# Patient Record
Sex: Female | Born: 1955 | Hispanic: No | Smoking: Current every day smoker
Health system: Southern US, Community
[De-identification: ages and names within clinical notes are randomized; demographics above are authoritative.]

## PROBLEM LIST (undated history)

## (undated) DIAGNOSIS — R569 Unspecified convulsions: Secondary | ICD-10-CM

## (undated) DIAGNOSIS — F329 Major depressive disorder, single episode, unspecified: Secondary | ICD-10-CM

## (undated) DIAGNOSIS — I10 Essential (primary) hypertension: Secondary | ICD-10-CM

## (undated) DIAGNOSIS — F32A Depression, unspecified: Secondary | ICD-10-CM

## (undated) DIAGNOSIS — K219 Gastro-esophageal reflux disease without esophagitis: Secondary | ICD-10-CM

## (undated) DIAGNOSIS — J449 Chronic obstructive pulmonary disease, unspecified: Secondary | ICD-10-CM

## (undated) DIAGNOSIS — E079 Disorder of thyroid, unspecified: Secondary | ICD-10-CM

## (undated) DIAGNOSIS — E039 Hypothyroidism, unspecified: Secondary | ICD-10-CM

## (undated) DIAGNOSIS — Z8619 Personal history of other infectious and parasitic diseases: Secondary | ICD-10-CM

## (undated) DIAGNOSIS — Z9889 Other specified postprocedural states: Secondary | ICD-10-CM

## (undated) DIAGNOSIS — R112 Nausea with vomiting, unspecified: Secondary | ICD-10-CM

## (undated) DIAGNOSIS — E119 Type 2 diabetes mellitus without complications: Secondary | ICD-10-CM

## (undated) DIAGNOSIS — I2510 Atherosclerotic heart disease of native coronary artery without angina pectoris: Secondary | ICD-10-CM

## (undated) HISTORY — PX: APPENDECTOMY: SHX54

## (undated) HISTORY — DX: Personal history of other infectious and parasitic diseases: Z86.19

## (undated) HISTORY — PX: TUBAL LIGATION: SHX77

---

## 2001-08-06 ENCOUNTER — Emergency Department (HOSPITAL_COMMUNITY): Admission: EM | Admit: 2001-08-06 | Discharge: 2001-08-06 | Payer: Self-pay | Admitting: Emergency Medicine

## 2002-07-17 ENCOUNTER — Ambulatory Visit (HOSPITAL_COMMUNITY): Admission: RE | Admit: 2002-07-17 | Discharge: 2002-07-17 | Payer: Self-pay | Admitting: Family Medicine

## 2003-01-03 ENCOUNTER — Encounter: Payer: Self-pay | Admitting: Emergency Medicine

## 2003-01-03 ENCOUNTER — Emergency Department (HOSPITAL_COMMUNITY): Admission: EM | Admit: 2003-01-03 | Discharge: 2003-01-03 | Payer: Self-pay | Admitting: Emergency Medicine

## 2004-09-12 ENCOUNTER — Emergency Department (HOSPITAL_COMMUNITY): Admission: EM | Admit: 2004-09-12 | Discharge: 2004-09-13 | Payer: Self-pay | Admitting: Emergency Medicine

## 2004-11-17 ENCOUNTER — Ambulatory Visit (HOSPITAL_COMMUNITY): Admission: RE | Admit: 2004-11-17 | Discharge: 2004-11-17 | Payer: Self-pay | Admitting: Family Medicine

## 2005-12-12 ENCOUNTER — Ambulatory Visit (HOSPITAL_COMMUNITY): Admission: RE | Admit: 2005-12-12 | Discharge: 2005-12-12 | Payer: Self-pay | Admitting: Family Medicine

## 2006-03-01 ENCOUNTER — Ambulatory Visit (HOSPITAL_COMMUNITY): Admission: RE | Admit: 2006-03-01 | Discharge: 2006-03-01 | Payer: Self-pay | Admitting: Family Medicine

## 2006-08-03 ENCOUNTER — Ambulatory Visit (HOSPITAL_COMMUNITY): Admission: RE | Admit: 2006-08-03 | Discharge: 2006-08-03 | Payer: Self-pay | Admitting: Family Medicine

## 2006-09-12 ENCOUNTER — Emergency Department (HOSPITAL_COMMUNITY): Admission: EM | Admit: 2006-09-12 | Discharge: 2006-09-12 | Payer: Self-pay | Admitting: Emergency Medicine

## 2006-11-26 ENCOUNTER — Inpatient Hospital Stay (HOSPITAL_COMMUNITY): Admission: EM | Admit: 2006-11-26 | Discharge: 2006-12-03 | Payer: Self-pay | Admitting: Emergency Medicine

## 2006-12-03 ENCOUNTER — Inpatient Hospital Stay (HOSPITAL_COMMUNITY): Admission: AD | Admit: 2006-12-03 | Discharge: 2006-12-07 | Payer: Self-pay | Admitting: *Deleted

## 2006-12-03 ENCOUNTER — Ambulatory Visit: Payer: Self-pay | Admitting: Psychiatry

## 2009-07-11 ENCOUNTER — Emergency Department (HOSPITAL_COMMUNITY): Admission: EM | Admit: 2009-07-11 | Discharge: 2009-07-11 | Payer: Self-pay | Admitting: Emergency Medicine

## 2010-08-03 NOTE — H&P (Signed)
[name], VANLOAN NO.:  [id number]   MEDICAL RECORD NO.:  [id number]          PATIENT TYPE:  IPS   LOCATION:  0502                          FACILITY:  [facility]   PHYSICIAN:  Geoffery Lyons, M.D.      DATE OF BIRTH:  January 21, 1956   DATE OF ADMISSION:  12/03/2006  DATE OF DISCHARGE:                       PSYCHIATRIC ADMISSION ASSESSMENT   IDENTIFYING INFORMATION:  This a 55 year old African-American female.  This is a voluntary admission.   HISTORY OF PRESENT ILLNESS:  This patient was transferred from the  medical unit at [facility] after she was admitted there on  November 26, 2006 after taking an overdose of Klonopin.  She claims that  she was not really intending to kill herself at that time and was  unclear about how many pills that she took.  She does admit that she had  been upset and frustrated with her husband who she felt had overspent  their budget.  Endorses having some financial problems.  Her husband had  called 9-1-1 and she had presented in the emergency room unresponsive.  Was groggy on admission to the medical unit and was hospitalized for  observation.   PAST PSYCHIATRIC HISTORY:  Remarkable for history of depression.  The  patient has endorsed up to 10 prior suicide attempts.  She has a history  of prior admissions to [facility] approximately five years  ago, to a facility in [place] about 40 years ago and one admission to  [facility] approximately 10 years ago and has been followed as an  outpatient at [facility].  She is currently  followed by Dr. Betti Cruz there.   CURRENT MEDICATIONS:  At the time of admission, Lamictal 100 mg twice  daily, Synthroid 100 mcg daily, Provera 2.5 mg daily, Zantac 150 mg  b.i.d. and Klonopin 0.5 mg three times a day.   ALCOHOL/DRUG HISTORY:  The patient has reported that she had history of  alcohol abuse in the distant past and has more than 20 years of  sobriety.   ALLERGIES:  BUPROPION and BUSPAR and possibly DIOVAN.   SOCIAL HISTORY:  Unemployed female, married, having some chronic  conflict with her husband including some financial issues, living on a  limited budget.  No current legal problems.  Basic education.   FAMILY HISTORY:  Remarkable for both father and mother with history of  alcohol abuse.   MEDICAL HISTORY:  Current medical problems include status post Klonopin  overdose, elevated liver enzymes related to medications, improving, has  a history of headaches NOS, hypertension, seizure disorder and  hypothyroidism.   LABORATORY DATA:  Diagnostic studies were remarkable for elevated liver  function tests with an AST up to 236 and ALT of 290, which were  improving with her most recent levels.  SGOT 98, SGPT 84, alkaline  phosphatase 52 and total bilirubin 0.7.  Her TSH was within normal  limits at 0.474 and acute hepatitis panel was negative.  A CT scan of  the abdomen and pelvis was also unremarkable other than showing some  fatty liver disease.  MEDICATIONS:  Medications at transfer were levothyroxine 100 mcg daily,  ranitidine 150 mg b.i.d., Norvasc 5 mg daily and Keppra 500 mg b.i.d.  Keppra had been substituted for the Lamictal which was discontinued due  to the elevated liver enzymes and the patient's Provera was  discontinued.   MENTAL STATUS EXAM:  Fully alert female, pleasant and cooperative.  Bright affect.  Speech is normal in production, articulate, relevant.  She is forthcoming with history.  Fully engaged in conversation.  Admits  to quite a bit of frustration with her home situation and some financial  stresses at home.  Admits to being depressed.  Has complained of having  some recent medical problems including some dysfunctional vaginal  bleeding and some transient abdominal pains.  Denying any suicidal  thoughts today.  No evidence of psychosis or hallucinations.  No  homicidal thought.  Cognition is  preserved.   DIAGNOSES:  AXIS I:  Depressive disorder not otherwise specified.  History of alcohol abuse, in remission.  AXIS II:  Deferred.  AXIS III:  Status post Klonopin overdose, hypothyroidism, elevated  transaminases not otherwise specified, seizure disorder.  AXIS IV:  Moderate (chronic marital conflict).  AXIS V:  Current 50; past year 70+.   PLAN:  To voluntarily admit the patient with 15-minute checks in place.  We have discussed medication management for her depression and she has  agreed to a trial of Depakote 500 mg p.o. q.h.s. and we will discontinue  her Keppra at this time.  Will also start Cymbalta 30 mg daily and will  plan to monitor her liver enzymes closely with hepatic enzyme panel  scheduled for December 06, 2006 in the morning.  Meanwhile, she has had  some chronic constipation.  We will start her on Colace 100 mg daily,  MiraLax 17 grams daily p.r.n. and Bentyl 20 mg for abdominal cramping.   ESTIMATED LENGTH OF STAY:  Five days.      Margaret A. Scott, N.P.      Geoffery Lyons, M.D.  Electronically Signed    MAS/MEDQ  D:  12/04/2006  T:  12/04/2006  Job:  [id number]

## 2010-08-03 NOTE — Group Therapy Note (Signed)
NAME:  [name], [name]                 ACCOUNT NO.:  [id number]   MEDICAL RECORD NO.:  [id number]          PATIENT TYPE:  INP   LOCATION:  A204                          FACILITY:  [facility]   PHYSICIAN:  Skeet Latch, DO    DATE OF BIRTH:  July 03, 1955   DATE OF PROCEDURE:  11/28/2006  DATE OF DISCHARGE:                                 PROGRESS NOTE   SUBJECTIVE:  The patient is stable at this time.  The patient is a more  alert and answering questions today.  The patient is still slightly  lethargic and is not willing to answer specific questions.  The patient  has a sitter at bedside and appears to be stable.   OBJECTIVE:  VITAL SIGNS:  Pulse 81, respirations 20, blood pressure  130/83.  The patient is satting 97% on room air.  CARDIOVASCULAR:  Regular rate and rhythm.  No rubs, murmurs or gallops.  LUNGS:  Clear to auscultation bilaterally.  No rales, rhonchi, or  wheezing.  ABDOMEN:  Soft, nontender, nondistended, positive bowel sounds.  No  rigidity or guarding.  EXTREMITIES:  No clubbing, cyanosis or edema.  Neurologically the  patient is more alert and awake today.  Appears to be moving all  extremities at this time.   LABORATORY DATA:  Sodium 142, potassium 2.9, chloride 110, CO2 25,  glucose 133, BUN 1, creatinine 0.61.   ASSESSMENT/PLAN:  1. Status post drug overdose.  The patient seems to be more awake and      alert today.  Sitter still present at bedside with the patient.      Will await ACT team to revisit and give their input regarding if      the patient needs to be admitted to behavior health facility.  2. Hypokalemia.  This is being replaced via IV.  Will repeat her      potassium level in the a.m.      Skeet Latch, DO  Electronically Signed     SM/MEDQ  D:  11/29/2006  T:  11/29/2006  Job:  [id number]

## 2010-08-03 NOTE — Discharge Summary (Signed)
[name], [name]                 ACCOUNT NO.:  [id number]   MEDICAL RECORD NO.:  [id number]          PATIENT TYPE:  INP   LOCATION:  A204                          FACILITY:  [facility]   PHYSICIAN:  Osvaldo Shipper, MD     DATE OF BIRTH:  1955-04-08   DATE OF ADMISSION:  11/26/2006  DATE OF DISCHARGE:  09/13/2008LH                               DISCHARGE SUMMARY   The patient is to be transferred to a inpatient psychiatric facility.   Please see H&P dictated by Dr. Lilian Kapur regarding the patient's illness.   DISCHARGE DIAGNOSES:  1. Suicidal attempt by overdosing on Klonopin.  2. Abnormal LFTs likely related to medications, improving.  3. History of headaches, stable.  4. Blurred vision likely requiring corrective lenses.  5. History of hypertension, stable.  6. History of seizure disorder, stable.  7. History of hypothyroidism, stable.   BRIEF HOSPITAL COURSE:  Problem #1.  Suicidal attempt.  The patient is a  55 year old Caucasian female who had a history of suicidal attempts in  the past who was brought in by EMS after she presented apparently had a  suicidal attempt.  She took Klonopin.  It is unclear how many pills she  took.  She was unresponsive when she presented.  The patient was in the  intensive care unit where she was monitored closely, was transferred out  when she became more lucid and awake.  She has improves significantly in  terms of her mental status and was fully awake and alert over the past  few days.  I found the patient to be very teary-eyed and crying.  She  stated that she wants to go home and take care of her husband.  Clearly  I think that the patient's mood is very unstable at this time and she  would best benefit by an evaluation by a psychiatrist.  Hence, I will  consult the Behavior Health Team today so that she can be transferred to  an inpatient psychiatric unit.   Problem #2.  Her seizure disorder has been stable.  She is on Lamictal.  When we  restarted these medications, her LFTs started going up.  The  etiology was not very clear.  AST was 236 as of yesterday and ALT of 290  as of yesterday.  We did do further extensive work-up and followup CT  scan of the abdomen and pelvis which showed fatty liver; otherwise  unremarkable.  Ultrasound of the abdomen was also done which did not  show any cholecystitis or any other findings.  Subsequently all these  medications were stopped and the patient's LFTs went down.  Her AST is  down to 159, and ALT down to 90.  Hence, I think these will continue to  improve.  As an alternative medication for the seizures, I am going to  start her on Keppra which she can take.  Keppra does not have any  adverse effect on the liver and she should be all right.  Hepatitis  profile was also negative.   She was continued on levothyroxine.  During this admission  her blood  pressure was found to be elevated.  She was started on Norvasc and now  her blood pressures are running quite the optimal range.   I have also stopped her Provera for now and her Paxil and her Klonopin  obviously.  Her psychiatrist may determine if she can be restarted on  these medications.   Yesterday the patient was complaining of some headache and some blurred  vision.  She was having almost double vision when she was watching the  TV.  However, she was as not having any double vision when she was  looking at me or when I did a quick visual check in the form of showing  her various objects and she was having any diplopia with that.  I think  the patient is mainly having some myopia.  She has been more than 10  years since she has seen an ophthalmologist.  I have asked her to see an  ophthalmologist as an outpatient.  She may need corrective lenses.  To  be sure there was nothing intracranial, we did a obtain a CT scan that  showed hyperdense sinuses which prompted MRA which showed irregular left  transverse sinus; otherwise  unremarkable.  So in view of all of the  above, the patient was considered stable for discharge today to an  outside facility.  The patient will not be discharged to home because of  her unstable mental health status.   DISCHARGE MEDICATIONS:  1. Levothyroxine 100 mcg daily.  2. Ranitidine 150 mg b.i.d.  3. Norvasc 5 mg daily.  4. Keppra 500 mg b.i.d.   FOLLOW UP:  To be determined at the time of discharge from psychiatric  facility.   Total time of discharge:  40 minutes.      Osvaldo Shipper, MD  Electronically Signed     GK/MEDQ  D:  12/02/2006  T:  12/02/2006  Job:  [id number]

## 2010-08-03 NOTE — Group Therapy Note (Signed)
NAME:  [name], [name]                 ACCOUNT NO.:  [id number]   MEDICAL RECORD NO.:  [id number]          PATIENT TYPE:  INP   LOCATION:  IC10                          FACILITY:  [facility]   PHYSICIAN:  Skeet Latch, DO    DATE OF BIRTH:  [birth year]   DATE OF PROCEDURE:  11/27/2006  DATE OF DISCHARGE:                                 PROGRESS NOTE   SUBJECTIVE:  The patient is seen to be stable at this time. The patient  is still very groggy.  On exam will not answer questions. The patient is  moving in bed and thrashing. The patient states that she feels like  someone is hurting her when we touch her for exam. The patient's  speech  is very sluggish at this time, but she does not appear to be in any  acute distress.   OBJECTIVE:  VITAL SIGNS: Temperature is 99.4, blood pressure 111/69,  respiratory rate is 19, pulse is 98.  CARDIOVASCULAR: Regular rate and rhythm, no prominent murmurs, rubs or  gallops.  LUNGS: The lungs are clear to auscultation without any rubs, rhonchi or  wheezes.  ABDOMEN: The abdomen is soft, nontender, nondistended. Positive bowel  sounds. No rigidity or guarding.  EXTREMITIES: No clubbing, cyanosis or edema.  NEUROLOGICAL: The patient is very sluggish and groggy on my exam. The  patient will answer questions at times. The patient seen to be moving  upper and lower extremities.   LABORATORY DATA:  TSH is 0.474. White count is 10.2, hemoglobin was  12.0, hematocrit 34.9, platelets 253.  ABG, pH 7.39, PCO2 35.5, PO2  39.2, bicarb 21.5, sodium 144, potassium 3.5, chloride 116, CO2 24,  glucose 113, BUN 6, creatinine 0.67. Alcohol level is less than 5. PTT  is 31, PT 13.7, INR is 1.0. Phosphorus 2.4, magnesium 1.9, calcium is  8.6.   ASSESSMENT AND PLAN:  1. Status post drug overdose. The patient continues to be somewhat      groggy secondary to receiving Ativan and Haldol. The patient does      not seem to be cooperative when she is alert and receives  sedatives. ACT team did see the patient and felt the patient was      too groggy to be evaluated at this time. I believe ACT team will      try to evaluate again in the morning.  2. Hypovolemia. This was seen to be resolved at this time.  3. The patient had history of depression. This probably will be      evaluated when she is stable for discharge probably to behavioral      health. I anticipate the patient being discharged in the next 1 to      2 days when stable.      Skeet Latch, DO  Electronically Signed     SM/MEDQ  D:  11/27/2006  T:  11/28/2006  Job:  [id number]

## 2010-08-03 NOTE — H&P (Signed)
NAME:  [name], [name]                 ACCOUNT NO.:  [id number]   MEDICAL RECORD NO.:  [id number]          PATIENT TYPE:  INP   LOCATION:  IC10                          FACILITY:  [facility]   PHYSICIAN:  Skeet Latch, DO    DATE OF BIRTH:  02-Jun-1955   DATE OF ADMISSION:  11/26/2006  DATE OF DISCHARGE:  LH                              HISTORY & PHYSICAL   CHIEF COMPLAINT:  Drug overdose.   HISTORY OF PRESENT ILLNESS:  This is a 55 year old Caucasian female who  presents via EMS secondary to an apparent suicide attempt.  Apparently,  the patient has a history of two prior suicide attempts, and today took  what is believed to be Klonopin.  How many Klonopin pills that the  patient took is unknown at this time.  Apparently, husband/boyfriend was  committing patient voluntarily when he called 9-1-1.  He stated that the  patient overdosed on pills.  On examination there were no husband,  friends or family present, so this history is obtained from the medical  chart.  Apparently, Narcan was given via EMS with minimal effect.  Apparently, the patient was found to have some Klonopin dated back from  July via the nursing staff.  The patient is extremely groggy on exam,  and I cannot get a medical history from the patient.   PAST MEDICAL HISTORY:  1. Depression.  2. Hypothyroidism.  3. Seizure disorder.  4. Prior suicide attempt.   PAST SURGICAL HISTORY:  None.   SOCIAL HISTORY:  Current smoker.  No history of alcohol abuse or illicit  drug use.   ALLERGIES:  UNABLE TO OBTAIN.  POSSIBLY WELLBUTRIN AND DIOVAN.   HOME MEDICATIONS:  1. Lamictal 100 mg b.i.d.  2. Levothyroxine 100 mcg as needed.  3. Provera 2.5 mg once daily.  4. Zantac 150 mg b.i.d.  5  Klonopin 0.5 mg three times a day.   REVIEW OF SYSTEMS:  Unobtainable.   PHYSICAL EXAMINATION:  GENERAL:  The patient is groggy.  Will respond to  questions by moving her head back and forth .  VITAL SIGNS:  Blood pressure 112/71, pulse  97, respirations 18,  temperature 97.6 .  HEENT:  Normocephalic, atraumatic.  Pupils are sluggish bilaterally.  No  scleral icterus is noted.  NECK:  Soft, supple, nondistended.  Oral mucosa is moist.  CARDIOVASCULAR:  Regular rate and rhythm.  No murmurs, rubs or gallops.  LUNGS:  Clear to auscultation bilaterally.  No rhonchi, rales or  wheezes.  ABDOMEN:  Soft, nondistended, positive bowel sounds .  EXTREMITIES:  No cyanosis, clubbing or edema.  NEUROLOGICAL:  Cranial nerves II-XII grossly intact.  The patient is  extremely drowsy.   LABORATORY DATA:  White count 7.5, hemoglobin 13.0, hematocrit 37.6,  platelets 238.  Urine pregnancy test was negative.  Patient is positive  for benzodiazepines on urine drug screen.  Acetaminophen level is less  than 10.  Sodium 134, potassium 2.8, chloride 102, CO2 25, glucose 165,  BUN 8, creatinine 0.77, calcium 8.1, total protein 6.1, AST 25, ALT 27,  salicylate level less than  4.   ASSESSMENT:  1. Drug overdose.  2. Hypokalemia.  3. History of depression.  4. History of hypothyroidism.  5. History of seizure disorder.  6. Prior suicide attempts.   PLAN:  The patient will be admitted to ICU for close up monitoring.  The  patient is not intubated at this time.  Her vital signs seem to be  stable.  Will follow vital signs closely.  Will get a repeat metabolic  panel in the next four hours.  The patient will be placed on suicide as  well as seizure precautions.  I will get strict I's and O's.  Foley will  be maintained.  She will be IV hydrated.  The patient will get an ACT  team consult when stable also.  Will get an ABG in the a.m. as well as  an EKG.  Will consult E. Link to manage overnight also.      Skeet Latch, DO  Electronically Signed     SM/MEDQ  D:  11/26/2006  T:  11/27/2006  Job:  [id number]

## 2010-08-06 NOTE — Discharge Summary (Signed)
NAMEJENAVI, BEEDLE NO.:  [id number]   MEDICAL RECORD NO.:  [id number]          PATIENT TYPE:  IPS   LOCATION:  0502                          FACILITY[facility]  [facility]   PHYSICIAN:  Geoffery Lyons, M.D.      DATE OF BIRTH:  05-24-1955   DATE OF ADMISSION:  12/03/2006  DATE OF DISCHARGE:  12/07/2006                               DISCHARGE SUMMARY   CHIEF COMPLAINT AND PRESENT ILLNESS:  This was the first admission to  [facility] for this 55 year old African-American  female voluntarily admitted.  Transferred from the medical unit at [facility].  She was admitted November 26, 2006 after taking an overdose of  Klonopin.  Claimed that she was not really intending to kill herself at  that time.  It was unclear about how many pills she took.  She had been  upset and frustrated with her husband whom she felt had overspent their  budget.  Endorsed having some financial problems.  Husband called 9-1-1.  She presented to the ED unresponsive.   PAST PSYCHIATRIC HISTORY:  Longstanding history of depression.  Endorsed  up to 10 prior suicide attempts.  Prior admissions to [facility] five years ago, to a facility in [place] about four years  ago and [facility] 10 years ago.  Some outpatient at [facility].  Currently seeing Dr. Betti Cruz.   ALCOHOL/DRUG HISTORY:  Claims 20 years sobriety.   MEDICATIONS:  Lamictal 100 mg twice a day, Synthroid 100 mcg per day,  Provera 2.5 mg per day, Zantac 150 mg twice a day, Klonopin 0.5 mg three  times a day.   PHYSICAL EXAMINATION:  Performed and failed to show any acute findings.   LABORATORY DATA:  SGOT 236, SGPT 290.  Repeated several days, SGOT 98,  SGPT 84, bilirubin 0.7, TSH 0.474.  Hepatitis panel negative.  CT scan  of the abdomen and pelvis was unremarkable other than showing some fatty  liver.   MEDICATIONS:  Levothyroxine 100 mcg per day, Zantac 150 mg twice a day,  Norvasc 5 mg  per day, Keppra 500 mg twice a day.  Keppra had been  substituted for the Lamictal.  Was discontinued due to the elevated  liver enzymes.   MENTAL STATUS EXAM:  Fully alert female, pleasant, cooperative.  Speech  was normal in tempo and production, articulate, relevant.  Forthcoming  with the history, engaged in the conversation.  Endorsed frustration  with the home situation, some financial stress.  Admits to being  depressed but denied any active suicidal or homicidal ideation.  No  evidence of delusions.  No hallucinations.  Cognition well-preserved.   ADMISSION DIAGNOSES:  AXIS I:  Major depressive disorder.  AXIS II:  No diagnosis.  AXIS III:  Hypothyroidism, elevated transaminases, seizure disorder.  AXIS IV:  Moderate.  AXIS V:  GAF upon admission 45; highest GAF in the last year 70.   HOSPITAL COURSE:  She was admitted.  She was started in individual and  group psychotherapy.  She was maintained on the Norvasc 5  mg per day,  Keppra 500 mg twice a day, Provera 2.5 mg per day, Synthroid 100 mcg per  day, Zantac 150 mg twice a day.  She had been on Xanax 0.5 mg every  eight hours as needed for anxiety.  Keppra was eventually discontinued.  She was placed on Depakote ER 500 mg at night, Cymbalta 30 mg per day.  Lamictal had also been discontinued.  As already stated, this was a 68-  year-old female, married, four children, ages 27, 55, 93 and 63, who  endorsed she really was not trying to hurt herself.  Claimed she was  taking a shower, stepped out, slipped, hit her head, claims she was  experiencing a headache, could not help herself but started taking the  Klonopin as she cannot take a lot of the analgesics.  She passed out.  She was taken to the ED and the ICU.  Endorsed she had been diagnosed  bipolar.  Described episodes of hyperthymia with increased energy,  racing thoughts, pressured speech, decreased sleep, impulsive behavior  for weeks, then depression with no energy, no  motivation.  Has had the  suicide attempts/gestures in the past when she was depressed.  Last time  in 1999.  Has a history of seizures.  Was on Lamictal to treat the  bipolar and the seizures yet says it was too sedating, affected her  coordination for what she went to a decrease and went up to the  Klonopin.  Had been on Paxil, Zoloft, Prozac, Wellbutrin, BuSpar,  Serzone, lithium, Risperdal and Abilify with no benefit or side effects.  Has been at [facility].  Unable to work yet denied  disability.  By December 05, 2006, she endorsed she was having a very  difficult time with sleep, woke up the night before with a headache,  wanted to have the Xanax at night as she claimed it did help in the  medical floor.  Endorsed that she really wanted to give the Depakote a  good try.  Wanted to see if she could get the help for her mood swings.  Still feeling pretty unstable.  Worried about the conflict with the  husband.  So we pursued the Depakote.  She did sleep all night with the  Xanax.  She tolerated the Depakote well.  The husband was going to come  and sit with her.  She was going to identify all the things that she saw  going wrong in the relationship.  Endorsed that she is back home alone  in her room, restricted to her room.  She pretty much thinks about  giving up.  By December 07, 2006, she was in full contact with reality.  There were no active suicidal or homicidal ideation.  No delusions.  No  hallucinations.  She had tolerated the medication well.  Felt that her  mood was more stable.  Was willing and motivated to pursue further  outpatient treatment.  Felt she could handle the situation at home much  better.   DISCHARGE DIAGNOSES:  AXIS I:  Bipolar disorder, depressed.  AXIS II:  No diagnosis.  AXIS III:  Hypothyroidism, elevated transaminases.  AXIS IV:  Moderate.  AXIS V:  GAF upon discharge 50.   DISCHARGE MEDICATIONS:  1. Norvasc 5 mg per day.  2.  Synthroid 100 mcg per day.  3. Zantac 150 mg per day.  4. Cymbalta 30 mg per day.  5. Depakote ER 500 mg at night.  6. Xanax  0.5 mg twice a day and at bedtime.  7. Pepcid 20 mg per day.   FOLLOWUP:  [facility].      Geoffery Lyons, M.D.  Electronically Signed     IL/MEDQ  D:  12/26/2006  T:  12/27/2006  Job:  [id number]

## 2010-12-30 LAB — HEPATIC FUNCTION PANEL
AST: 50 — ABNORMAL HIGH
Albumin: 4
Alkaline Phosphatase: 57
Total Bilirubin: 1.1
Total Protein: 7.1

## 2010-12-31 LAB — TSH: TSH: 0.474

## 2010-12-31 LAB — COMPREHENSIVE METABOLIC PANEL
ALT: 25
ALT: 79 — ABNORMAL HIGH
ALT: 94 — ABNORMAL HIGH
AST: 159 — ABNORMAL HIGH
Albumin: 3.8
Albumin: 3.9
Alkaline Phosphatase: 52
Alkaline Phosphatase: 56
Alkaline Phosphatase: 58
BUN: 6
BUN: 6
BUN: 8
BUN: 9
BUN: <1 — ABNORMAL LOW
CO2: 24
CO2: 26
CO2: 27
Calcium: 8.4
Calcium: 9.5
Calcium: 9.6
Chloride: 102
Chloride: 106
Chloride: 108
Creatinine, Ser: 0.67
Creatinine, Ser: 0.69
Creatinine, Ser: 0.77
GFR calc Af Amer: >60
GFR calc non Af Amer: >60
GFR calc non Af Amer: >60
GFR calc non Af Amer: >60
Glucose, Bld: 113 — ABNORMAL HIGH
Glucose, Bld: 124 — ABNORMAL HIGH
Glucose, Bld: 124 — ABNORMAL HIGH
Glucose, Bld: 165 — ABNORMAL HIGH
Potassium: 2.8 — ABNORMAL LOW
Potassium: 3.6
Potassium: 4
Sodium: 141
Sodium: 144
Sodium: 144
Total Bilirubin: 0.8
Total Bilirubin: 0.9
Total Bilirubin: 0.9
Total Protein: 5.9 — ABNORMAL LOW
Total Protein: 6.3
Total Protein: 6.5

## 2010-12-31 LAB — DIFFERENTIAL
Basophils Absolute: 0
Basophils Absolute: 0
Basophils Relative: 0
Basophils Relative: 1
Eosinophils Relative: 1
Lymphocytes Relative: 19
Lymphocytes Relative: 22
Lymphocytes Relative: 32
Lymphs Abs: 2
Monocytes Relative: 9
Neutro Abs: 3.3
Neutro Abs: 5.3
Neutrophils Relative %: 55
Neutrophils Relative %: 71

## 2010-12-31 LAB — AMYLASE
Amylase: 31
Amylase: 33

## 2010-12-31 LAB — BLOOD GAS, ARTERIAL
O2 Content: 2
Patient temperature: 98.6

## 2010-12-31 LAB — BASIC METABOLIC PANEL
BUN: 1 — ABNORMAL LOW
BUN: 3 — ABNORMAL LOW
BUN: <1 — ABNORMAL LOW
CO2: 25
Calcium: 8.9
Calcium: 9
Chloride: 112
Creatinine, Ser: 0.56
Creatinine, Ser: 0.61
Creatinine, Ser: 0.63
GFR calc Af Amer: >60
GFR calc non Af Amer: >60
Glucose, Bld: 137 — ABNORMAL HIGH
Sodium: 145

## 2010-12-31 LAB — URINALYSIS, ROUTINE W REFLEX MICROSCOPIC
Nitrite: NEGATIVE
Protein, ur: NEGATIVE
Specific Gravity, Urine: <1.005 — ABNORMAL LOW
Urobilinogen, UA: 0.2

## 2010-12-31 LAB — HEPATIC FUNCTION PANEL
ALT: 84 — ABNORMAL HIGH
AST: 98 — ABNORMAL HIGH
Albumin: 3.6
Bilirubin, Direct: 0.1
Bilirubin, Direct: 0.1
Indirect Bilirubin: 0.7
Total Bilirubin: 0.7
Total Protein: 5.9 — ABNORMAL LOW

## 2010-12-31 LAB — CBC
HCT: 34.9 — ABNORMAL LOW
HCT: 37.6
Hemoglobin: 12.7
Hemoglobin: 13
MCV: 90
Platelets: 248
Platelets: 253
Platelets: 263
RBC: 3.86 — ABNORMAL LOW
RDW: 12.4
RDW: 12.5
WBC: 10.2

## 2010-12-31 LAB — RAPID URINE DRUG SCREEN, HOSP PERFORMED
Barbiturates: NOT DETECTED
Cocaine: NOT DETECTED
Opiates: NOT DETECTED

## 2010-12-31 LAB — ACETAMINOPHEN LEVEL: Acetaminophen (Tylenol), Serum: <10 — ABNORMAL LOW

## 2010-12-31 LAB — HEPATITIS PANEL, ACUTE
Hep A IgM: NEGATIVE
Hep B C IgM: NEGATIVE

## 2010-12-31 LAB — MAGNESIUM
Magnesium: 1.8
Magnesium: 1.9

## 2010-12-31 LAB — URINE MICROSCOPIC-ADD ON

## 2010-12-31 LAB — PHOSPHORUS
Phosphorus: 2.4
Phosphorus: 3.4

## 2010-12-31 LAB — LIPASE, BLOOD
Lipase: 15
Lipase: 15

## 2010-12-31 LAB — PROTIME-INR
INR: 1
Prothrombin Time: 13.7

## 2010-12-31 LAB — SALICYLATE LEVEL: Salicylate Lvl: <4

## 2010-12-31 LAB — APTT: aPTT: 31

## 2011-01-05 LAB — CBC
HCT: 38.4
Hemoglobin: 13.5
MCHC: 35
Platelets: 300
RDW: 13.1

## 2011-01-05 LAB — DIFFERENTIAL
Basophils Absolute: 0.1
Basophils Relative: 1
Eosinophils Relative: 4
Monocytes Absolute: 0.5
Neutro Abs: 3.5

## 2011-01-05 LAB — BASIC METABOLIC PANEL
BUN: 6
CO2: 24
Calcium: 9.1
Glucose, Bld: 110 — ABNORMAL HIGH
Sodium: 141

## 2012-11-12 ENCOUNTER — Encounter (HOSPITAL_COMMUNITY): Payer: Self-pay | Admitting: *Deleted

## 2012-11-12 ENCOUNTER — Emergency Department (HOSPITAL_COMMUNITY)
Admission: EM | Admit: 2012-11-12 | Discharge: 2012-11-12 | Disposition: A | Discharge status: Home / Self Care | Payer: Self-pay | Attending: Emergency Medicine | Admitting: Emergency Medicine

## 2012-11-12 DIAGNOSIS — Z23 Encounter for immunization: Secondary | ICD-10-CM | POA: Insufficient documentation

## 2012-11-12 DIAGNOSIS — X58XXXA Exposure to other specified factors, initial encounter: Secondary | ICD-10-CM | POA: Insufficient documentation

## 2012-11-12 DIAGNOSIS — F329 Major depressive disorder, single episode, unspecified: Secondary | ICD-10-CM | POA: Insufficient documentation

## 2012-11-12 DIAGNOSIS — I10 Essential (primary) hypertension: Secondary | ICD-10-CM | POA: Insufficient documentation

## 2012-11-12 DIAGNOSIS — Z862 Personal history of diseases of the blood and blood-forming organs and certain disorders involving the immune mechanism: Secondary | ICD-10-CM | POA: Insufficient documentation

## 2012-11-12 DIAGNOSIS — Z8639 Personal history of other endocrine, nutritional and metabolic disease: Secondary | ICD-10-CM | POA: Insufficient documentation

## 2012-11-12 DIAGNOSIS — Y929 Unspecified place or not applicable: Secondary | ICD-10-CM | POA: Insufficient documentation

## 2012-11-12 DIAGNOSIS — S0502XA Injury of conjunctiva and corneal abrasion without foreign body, left eye, initial encounter: Secondary | ICD-10-CM

## 2012-11-12 DIAGNOSIS — S058X9A Other injuries of unspecified eye and orbit, initial encounter: Secondary | ICD-10-CM | POA: Insufficient documentation

## 2012-11-12 DIAGNOSIS — F411 Generalized anxiety disorder: Secondary | ICD-10-CM | POA: Insufficient documentation

## 2012-11-12 DIAGNOSIS — F3289 Other specified depressive episodes: Secondary | ICD-10-CM | POA: Insufficient documentation

## 2012-11-12 DIAGNOSIS — I2510 Atherosclerotic heart disease of native coronary artery without angina pectoris: Secondary | ICD-10-CM | POA: Insufficient documentation

## 2012-11-12 DIAGNOSIS — F17200 Nicotine dependence, unspecified, uncomplicated: Secondary | ICD-10-CM | POA: Insufficient documentation

## 2012-11-12 DIAGNOSIS — G40909 Epilepsy, unspecified, not intractable, without status epilepticus: Secondary | ICD-10-CM | POA: Insufficient documentation

## 2012-11-12 DIAGNOSIS — Y939 Activity, unspecified: Secondary | ICD-10-CM | POA: Insufficient documentation

## 2012-11-12 HISTORY — DX: Essential (primary) hypertension: I10

## 2012-11-12 HISTORY — DX: Unspecified convulsions: R56.9

## 2012-11-12 HISTORY — DX: Atherosclerotic heart disease of native coronary artery without angina pectoris: I25.10

## 2012-11-12 HISTORY — DX: Disorder of thyroid, unspecified: E07.9

## 2012-11-12 HISTORY — DX: Depression, unspecified: F32.A

## 2012-11-12 HISTORY — DX: Major depressive disorder, single episode, unspecified: F32.9

## 2012-11-12 MED ORDER — TOBRAMYCIN 0.3 % OP SOLN
2.0000 [drp] | OPHTHALMIC | Status: DC
  Administered 2012-11-12: 2 [drp] via OPHTHALMIC
  Filled 2012-11-12: qty 5

## 2012-11-12 MED ORDER — TETANUS-DIPHTH-ACELL PERTUSSIS 5-2.5-18.5 LF-MCG/0.5 IM SUSP
0.5000 mL | Freq: Once | INTRAMUSCULAR | Status: AC
  Administered 2012-11-12: 0.5 mL via INTRAMUSCULAR
  Filled 2012-11-12: qty 0.5

## 2012-11-12 MED ORDER — KETOROLAC TROMETHAMINE 10 MG PO TABS
10.0000 mg | ORAL_TABLET | Freq: Once | ORAL | Status: AC
  Administered 2012-11-12: 10 mg via ORAL
  Filled 2012-11-12: qty 1

## 2012-11-12 MED ORDER — FLUORESCEIN SODIUM 1 MG OP STRP
ORAL_STRIP | OPHTHALMIC | Status: AC
  Administered 2012-11-12: 19:00:00
  Filled 2012-11-12: qty 1

## 2012-11-12 MED ORDER — HYDROCODONE-ACETAMINOPHEN 5-325 MG PO TABS
2.0000 | ORAL_TABLET | Freq: Once | ORAL | Status: AC
  Administered 2012-11-12: 2 via ORAL
  Filled 2012-11-12: qty 2

## 2012-11-12 MED ORDER — TETRACAINE HCL 0.5 % OP SOLN
OPHTHALMIC | Status: AC
  Administered 2012-11-12: 19:00:00
  Filled 2012-11-12: qty 2

## 2012-11-12 MED ORDER — HYDROCODONE-ACETAMINOPHEN 5-325 MG PO TABS: 1.0000 | ORAL_TABLET | ORAL | Status: DC | PRN

## 2012-11-12 MED ORDER — MELOXICAM 7.5 MG PO TABS: ORAL_TABLET | ORAL | Status: DC

## 2012-11-12 MED ORDER — ONDANSETRON HCL 4 MG PO TABS
4.0000 mg | ORAL_TABLET | Freq: Once | ORAL | Status: AC
  Administered 2012-11-12: 4 mg via ORAL
  Filled 2012-11-12: qty 1

## 2012-11-12 NOTE — ED Provider Notes (Signed)
Medical screening examination/treatment/procedure(s) were performed by non-physician practitioner and as supervising physician I was immediately available for consultation/collaboration.   Maddix Heinz L Jere Bostrom, MD 11/12/12 2022 

## 2012-11-12 NOTE — ED Notes (Signed)
Slit lamp to bedside.

## 2012-11-12 NOTE — ED Provider Notes (Signed)
CSN: [id number]     Arrival date & time 11/12/12  1739 History   First MD Initiated Contact with Patient 11/12/12 1809     Chief Complaint  Patient presents with  . Eye Pain   (Consider location/radiation/quality/duration/timing/severity/associated sxs/prior Treatment) Patient is a 57 y.o. female presenting with eye pain. The history is provided by the patient.  Eye Pain This is a new problem. The current episode started in the past 7 days. The problem occurs constantly. The problem has been gradually worsening. Pertinent negatives include no abdominal pain, arthralgias, chest pain, coughing, fever or neck pain. Exacerbated by: bright light. She has tried nothing for the symptoms. The treatment provided no relief.    Past Medical History  Diagnosis Date  . Hypertension   . Seizures   . Depression   . Thyroid disease   . Coronary artery disease    Past Surgical History  Procedure Laterality Date  . Tubal ligation     History reviewed. No pertinent family history. History  Substance Use Topics  . Smoking status: Current Every Day Smoker  . Smokeless tobacco: Not on file  . Alcohol Use: No   OB History   Grav Para Term Preterm Abortions TAB SAB Ect Mult Living                 Review of Systems  Constitutional: Negative for fever and activity change.       All ROS Neg except as noted in HPI  HENT: Negative for nosebleeds and neck pain.   Eyes: Positive for pain. Negative for photophobia and discharge.  Respiratory: Negative for cough, shortness of breath and wheezing.   Cardiovascular: Negative for chest pain and palpitations.  Gastrointestinal: Negative for abdominal pain and blood in stool.  Genitourinary: Negative for dysuria, frequency and hematuria.  Musculoskeletal: Negative for back pain and arthralgias.  Skin: Negative.   Neurological: Positive for seizures. Negative for dizziness and speech difficulty.  Psychiatric/Behavioral: Negative for hallucinations and  confusion.       Depression    Allergies  Buspar; Wellbutrin; and Zoloft  Home Medications   Current Outpatient Rx  Name  Route  Sig  Dispense  Refill  . HYDROcodone-acetaminophen (NORCO/VICODIN) 5-325 MG per tablet   Oral   Take 1 tablet by mouth every 4 (four) hours as needed for pain.   15 tablet   0   . meloxicam (MOBIC) 7.5 MG tablet      1 po bid with food   12 tablet   0    BP 115/63  Pulse 101  Temp(Src) 98.8 F (37.1 C) (Oral)  Resp 20  Ht 5\' 1"  (1.549 m)  Wt 120 lb (54.432 kg)  BMI 22.69 kg/m2  SpO2 100% Physical Exam  Nursing note and vitals reviewed. Constitutional: She is oriented to person, place, and time. She appears well-developed and well-nourished.  Non-toxic appearance.  HENT:  Head: Normocephalic.  Right Ear: Tympanic membrane and external ear normal.  Left Ear: Tympanic membrane and external ear normal.  Eyes: EOM and lids are normal. Pupils are equal, round, and reactive to light. Left eye exhibits no hordeolum. No foreign body present in the left eye. Left conjunctiva is injected.  Fundoscopic exam:      The left eye shows no AV nicking, no hemorrhage and no papilledema.  Slit lamp exam:      The left eye shows corneal abrasion and fluorescein uptake. The left eye shows no foreign body and no hyphema.  There is increase redness of the conjunctiva under the upper lid. Soreness of this area. No periorbital area redness or increase warmth. Anterior chamber clear.  Neck: Normal range of motion. Neck supple. Carotid bruit is not present.  Cardiovascular: Normal rate, regular rhythm, normal heart sounds, intact distal pulses and normal pulses.   Pulmonary/Chest: Breath sounds normal. No respiratory distress.  Abdominal: Soft. Bowel sounds are normal. There is no tenderness. There is no guarding.  Musculoskeletal: Normal range of motion.  Lymphadenopathy:       Head (right side): No submandibular adenopathy present.       Head (left side): No  submandibular adenopathy present.    She has no cervical adenopathy.  Neurological: She is alert and oriented to person, place, and time. She has normal strength. No cranial nerve deficit or sensory deficit.  Skin: Skin is warm and dry.  Psychiatric: Her speech is normal. Her mood appears anxious.    ED Course  Procedures (including critical care time) Labs Review Labs Reviewed - No data to display Imaging Review No results found.  MDM   1. Corneal abrasion, left, initial encounter    **I have reviewed nursing notes, vital signs, and all appropriate lab and imaging results for this patient.*  Pt noted to have fluorescein uptake at the 4:00 and center cornea positions. Pt sore at the upper lip area. No FB noted. Pt treated with tobramycin eye drops, mobic, and norco. Pt to see the opthalmologist for evaluaiton if not improving.  Kathie Dike, PA-C 11/12/12 1912

## 2012-11-12 NOTE — ED Notes (Signed)
Pain lt eye for 3 days, no injury,

## 2012-11-27 ENCOUNTER — Emergency Department (HOSPITAL_COMMUNITY)
Admission: EM | Admit: 2012-11-27 | Discharge: 2012-11-28 | Disposition: A | Discharge status: Home / Self Care | Payer: Self-pay | Attending: Emergency Medicine | Admitting: Emergency Medicine

## 2012-11-27 ENCOUNTER — Encounter (HOSPITAL_COMMUNITY): Payer: Self-pay

## 2012-11-27 DIAGNOSIS — F329 Major depressive disorder, single episode, unspecified: Secondary | ICD-10-CM

## 2012-11-27 DIAGNOSIS — Z9851 Tubal ligation status: Secondary | ICD-10-CM | POA: Insufficient documentation

## 2012-11-27 DIAGNOSIS — F17200 Nicotine dependence, unspecified, uncomplicated: Secondary | ICD-10-CM | POA: Insufficient documentation

## 2012-11-27 DIAGNOSIS — Z862 Personal history of diseases of the blood and blood-forming organs and certain disorders involving the immune mechanism: Secondary | ICD-10-CM | POA: Insufficient documentation

## 2012-11-27 DIAGNOSIS — R45851 Suicidal ideations: Secondary | ICD-10-CM | POA: Insufficient documentation

## 2012-11-27 DIAGNOSIS — I10 Essential (primary) hypertension: Secondary | ICD-10-CM | POA: Insufficient documentation

## 2012-11-27 DIAGNOSIS — I2510 Atherosclerotic heart disease of native coronary artery without angina pectoris: Secondary | ICD-10-CM | POA: Insufficient documentation

## 2012-11-27 DIAGNOSIS — Z8639 Personal history of other endocrine, nutritional and metabolic disease: Secondary | ICD-10-CM | POA: Insufficient documentation

## 2012-11-27 DIAGNOSIS — F3289 Other specified depressive episodes: Secondary | ICD-10-CM | POA: Insufficient documentation

## 2012-11-27 LAB — BASIC METABOLIC PANEL
BUN: 13 mg/dL (ref 6–23)
Calcium: 9.6 mg/dL (ref 8.4–10.5)
Creatinine, Ser: 0.67 mg/dL (ref 0.50–1.10)
GFR calc Af Amer: >90 mL/min (ref >90)
GFR calc non Af Amer: >90 mL/min (ref >90)
Glucose, Bld: 113 mg/dL — ABNORMAL HIGH (ref 70–99)

## 2012-11-27 LAB — CBC WITH DIFFERENTIAL/PLATELET
Basophils Relative: 0 % (ref 0–1)
Eosinophils Absolute: 0.2 10*3/uL (ref 0.0–0.7)
Eosinophils Relative: 2 % (ref 0–5)
Hemoglobin: 12.7 g/dL (ref 12.0–15.0)
Lymphs Abs: 3 10*3/uL (ref 0.7–4.0)
MCH: 32.2 pg (ref 26.0–34.0)
MCHC: 36 g/dL (ref 30.0–36.0)
MCV: 89.6 fL (ref 78.0–100.0)
Monocytes Absolute: 0.7 10*3/uL (ref 0.1–1.0)
Monocytes Relative: 6 % (ref 3–12)
Neutrophils Relative %: 62 % (ref 43–77)
RBC: 3.94 MIL/uL (ref 3.87–5.11)
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
## 2012-11-28 LAB — URINALYSIS, ROUTINE W REFLEX MICROSCOPIC
Bilirubin Urine: NEGATIVE
Glucose, UA: NEGATIVE mg/dL
Leukocytes, UA: NEGATIVE
Nitrite: NEGATIVE
Protein, ur: NEGATIVE mg/dL
Specific Gravity, Urine: <1.005 — ABNORMAL LOW (ref 1.005–1.030)
Urobilinogen, UA: 0.2 mg/dL (ref 0.0–1.0)
pH: 7 (ref 5.0–8.0)

## 2012-11-28 LAB — RAPID URINE DRUG SCREEN, HOSP PERFORMED
Cocaine: NOT DETECTED
Opiates: NOT DETECTED
Tetrahydrocannabinol: NOT DETECTED

## 2012-11-28 LAB — URINE MICROSCOPIC-ADD ON

## 2012-11-28 MED ORDER — HALOPERIDOL LACTATE 5 MG/ML IJ SOLN
10.0000 mg | Freq: Once | INTRAMUSCULAR | Status: DC
  Filled 2012-11-28: qty 2

## 2012-11-28 MED ORDER — NICOTINE 21 MG/24HR TD PT24: 21.0000 mg | MEDICATED_PATCH | Freq: Every day | TRANSDERMAL | Status: DC

## 2012-11-28 MED ORDER — POTASSIUM CHLORIDE CRYS ER 20 MEQ PO TBCR
40.0000 meq | EXTENDED_RELEASE_TABLET | Freq: Once | ORAL | Status: AC
  Administered 2012-11-28: 40 meq via ORAL
  Filled 2012-11-28: qty 2

## 2012-11-28 NOTE — ED Notes (Signed)
Sheriffs deputy J.Montez Morita here now. Was with pt when she was picked up @ home. States she was disorganized, manic, rambling speech, throwing clothing out of her dresser @ police. Police offered to help her get things for her purse (house keys etc) pt never asked for her glasses or bible.

## 2012-11-28 NOTE — ED Notes (Signed)
Husband not answering phone. Called [place], brother in law and states will try to get in touch with her husband. Belongings given to pt and awaiting ride.
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
## 2012-11-28 NOTE — ED Notes (Signed)
telepsych with social worker completed. Pt resting quietly now, does not want food/drink. Requesting a nicotine patch

## 2012-11-28 NOTE — ED Notes (Signed)
[employer] coming to pick pt up. Pt wanted husband to pick her up but husband didn't answer

## 2012-11-28 NOTE — ED Notes (Signed)
Pt back to sleep, haldol held for now

## 2012-11-28 NOTE — ED Provider Notes (Signed)
7:40 AM: On recheck pt states she has not been considering self-harm and denies SI and states "that's just what they said to get papers out on me."  Pt states that she will need police escort to get her items from her home so she can go to a shelter.  Explained treatment plan including social worker consult to help pt to find a shelter.  Pt has been cleared by Psych for discharge for outpatient treatment as long as she had a safe place to go when she leaves the ED.   Pt receives medical care at the [facility] and she sees Dr Geanie Cooley at [facility]  10:50 nurse reports social work has given patient referrals and is ready to be discharged.    Devoria Albe, MD, FACEP    I personally performed the services described in this documentation, which was scribed in my presence. The recorded information has been reviewed and considered.   Devoria Albe, MD, Armando Gang   Ward Givens, MD 11/28/12 1055

## 2013-01-21 ENCOUNTER — Emergency Department (HOSPITAL_COMMUNITY)
Admission: EM | Admit: 2013-01-21 | Discharge: 2013-01-21 | Discharge status: Against Medical Advice | Payer: Self-pay | Attending: Emergency Medicine | Admitting: Emergency Medicine

## 2013-01-21 ENCOUNTER — Encounter (HOSPITAL_COMMUNITY): Payer: Self-pay | Admitting: Emergency Medicine

## 2013-01-21 DIAGNOSIS — I2510 Atherosclerotic heart disease of native coronary artery without angina pectoris: Secondary | ICD-10-CM | POA: Insufficient documentation

## 2013-01-21 DIAGNOSIS — I959 Hypotension, unspecified: Secondary | ICD-10-CM | POA: Insufficient documentation

## 2013-01-21 DIAGNOSIS — I10 Essential (primary) hypertension: Secondary | ICD-10-CM | POA: Insufficient documentation

## 2013-01-21 DIAGNOSIS — F17200 Nicotine dependence, unspecified, uncomplicated: Secondary | ICD-10-CM | POA: Insufficient documentation

## 2013-01-21 NOTE — ED Notes (Signed)
Not in waiting room x 1 

## 2013-01-21 NOTE — ED Notes (Signed)
Pt states has been checking bp for past month and has been low. Also c/o headaches and "tired all the time" for a month. Headache constant x 1 month. Took tylenol once and helped. Nad. Denies dizziness. Sent by [facility].

## 2013-01-21 NOTE — ED Notes (Signed)
Not in waiting room x 2 

## 2013-01-21 NOTE — ED Notes (Signed)
Pt states Dr at urgent care wanted an EKG. Pt requesting EKG.

## 2014-01-07 ENCOUNTER — Emergency Department (HOSPITAL_COMMUNITY)
Admission: EM | Admit: 2014-01-07 | Discharge: 2014-01-07 | Discharge status: Against Medical Advice | Payer: Self-pay | Attending: Emergency Medicine | Admitting: Emergency Medicine

## 2014-01-07 ENCOUNTER — Encounter (HOSPITAL_COMMUNITY): Payer: Self-pay | Admitting: Emergency Medicine

## 2014-01-07 DIAGNOSIS — I10 Essential (primary) hypertension: Secondary | ICD-10-CM | POA: Insufficient documentation

## 2014-01-07 DIAGNOSIS — R05 Cough: Secondary | ICD-10-CM | POA: Insufficient documentation

## 2014-01-07 DIAGNOSIS — R509 Fever, unspecified: Secondary | ICD-10-CM | POA: Insufficient documentation

## 2014-01-07 DIAGNOSIS — Z720 Tobacco use: Secondary | ICD-10-CM | POA: Insufficient documentation

## 2014-01-07 DIAGNOSIS — J029 Acute pharyngitis, unspecified: Secondary | ICD-10-CM | POA: Insufficient documentation

## 2014-01-07 NOTE — ED Notes (Signed)
Fever, cough non productive.  Headache, sore throat.   Feels weak

## 2014-01-07 NOTE — ED Notes (Signed)
Registration says pt left

## 2014-05-20 ENCOUNTER — Encounter (HOSPITAL_COMMUNITY): Payer: Self-pay | Admitting: Emergency Medicine

## 2014-05-20 ENCOUNTER — Emergency Department (HOSPITAL_COMMUNITY)
Admission: EM | Admit: 2014-05-20 | Discharge: 2014-05-20 | Disposition: A | Discharge status: Home / Self Care | Payer: Self-pay | Attending: Emergency Medicine | Admitting: Emergency Medicine

## 2014-05-20 DIAGNOSIS — I10 Essential (primary) hypertension: Secondary | ICD-10-CM | POA: Insufficient documentation

## 2014-05-20 DIAGNOSIS — Z79899 Other long term (current) drug therapy: Secondary | ICD-10-CM | POA: Insufficient documentation

## 2014-05-20 DIAGNOSIS — E079 Disorder of thyroid, unspecified: Secondary | ICD-10-CM | POA: Insufficient documentation

## 2014-05-20 DIAGNOSIS — Z9851 Tubal ligation status: Secondary | ICD-10-CM | POA: Insufficient documentation

## 2014-05-20 DIAGNOSIS — R1013 Epigastric pain: Secondary | ICD-10-CM

## 2014-05-20 DIAGNOSIS — Z9049 Acquired absence of other specified parts of digestive tract: Secondary | ICD-10-CM | POA: Insufficient documentation

## 2014-05-20 DIAGNOSIS — I2510 Atherosclerotic heart disease of native coronary artery without angina pectoris: Secondary | ICD-10-CM | POA: Insufficient documentation

## 2014-05-20 DIAGNOSIS — E876 Hypokalemia: Secondary | ICD-10-CM | POA: Insufficient documentation

## 2014-05-20 DIAGNOSIS — F329 Major depressive disorder, single episode, unspecified: Secondary | ICD-10-CM | POA: Insufficient documentation

## 2014-05-20 DIAGNOSIS — G40909 Epilepsy, unspecified, not intractable, without status epilepticus: Secondary | ICD-10-CM | POA: Insufficient documentation

## 2014-05-20 DIAGNOSIS — K279 Peptic ulcer, site unspecified, unspecified as acute or chronic, without hemorrhage or perforation: Secondary | ICD-10-CM | POA: Insufficient documentation

## 2014-05-20 DIAGNOSIS — Z720 Tobacco use: Secondary | ICD-10-CM | POA: Insufficient documentation

## 2014-05-20 LAB — CBC WITH DIFFERENTIAL/PLATELET
BASOS ABS: 0.1 10*3/uL (ref 0.0–0.1)
BASOS PCT: 1 % (ref 0–1)
EOS PCT: 2 % (ref 0–5)
Eosinophils Absolute: 0.1 10*3/uL (ref 0.0–0.7)
HCT: 39.7 % (ref 36.0–46.0)
Hemoglobin: 14.2 g/dL (ref 12.0–15.0)
Lymphocytes Relative: 33 % (ref 12–46)
Lymphs Abs: 2.8 10*3/uL (ref 0.7–4.0)
MCH: 32.1 pg (ref 26.0–34.0)
MCHC: 35.8 g/dL (ref 30.0–36.0)
MCV: 89.6 fL (ref 78.0–100.0)
MONO ABS: 0.6 10*3/uL (ref 0.1–1.0)
MONOS PCT: 7 % (ref 3–12)
Neutro Abs: 5 10*3/uL (ref 1.7–7.7)
Neutrophils Relative %: 57 % (ref 43–77)
Platelets: 268 10*3/uL (ref 150–400)
RBC: 4.43 MIL/uL (ref 3.87–5.11)
RDW: 12.4 % (ref 11.5–15.5)
WBC: 8.6 10*3/uL (ref 4.0–10.5)

## 2014-05-20 LAB — URINALYSIS, ROUTINE W REFLEX MICROSCOPIC
BILIRUBIN URINE: NEGATIVE
Glucose, UA: NEGATIVE mg/dL
KETONES UR: NEGATIVE mg/dL
Leukocytes, UA: NEGATIVE
Nitrite: NEGATIVE
PROTEIN: NEGATIVE mg/dL
Specific Gravity, Urine: 1.01 (ref 1.005–1.030)
Urobilinogen, UA: 0.2 mg/dL (ref 0.0–1.0)
pH: 5.5 (ref 5.0–8.0)

## 2014-05-20 LAB — URINE MICROSCOPIC-ADD ON

## 2014-05-20 LAB — COMPREHENSIVE METABOLIC PANEL
ALBUMIN: 4.4 g/dL (ref 3.5–5.2)
ALT: 18 U/L (ref 0–35)
ANION GAP: 12 (ref 5–15)
AST: 25 U/L (ref 0–37)
Alkaline Phosphatase: 52 U/L (ref 39–117)
BILIRUBIN TOTAL: 0.6 mg/dL (ref 0.3–1.2)
BUN: 19 mg/dL (ref 6–23)
CALCIUM: 9.5 mg/dL (ref 8.4–10.5)
CO2: 28 mmol/L (ref 19–32)
CREATININE: 0.93 mg/dL (ref 0.50–1.10)
Chloride: 94 mmol/L — ABNORMAL LOW (ref 96–112)
GFR, EST AFRICAN AMERICAN: 75 mL/min — AB (ref >90)
GFR, EST NON AFRICAN AMERICAN: 65 mL/min — AB (ref >90)
Glucose, Bld: 106 mg/dL — ABNORMAL HIGH (ref 70–99)
Potassium: 2.7 mmol/L — CL (ref 3.5–5.1)
SODIUM: 134 mmol/L — AB (ref 135–145)
Total Protein: 7.8 g/dL (ref 6.0–8.3)

## 2014-05-20 LAB — LIPASE, BLOOD: Lipase: 28 U/L (ref 11–59)

## 2014-05-20 MED ORDER — POTASSIUM CHLORIDE CRYS ER 20 MEQ PO TBCR
40.0000 meq | EXTENDED_RELEASE_TABLET | Freq: Two times a day (BID) | ORAL | Status: DC
  Administered 2014-05-20: 40 meq via ORAL
  Filled 2014-05-20: qty 2

## 2014-05-20 MED ORDER — POTASSIUM CHLORIDE ER 10 MEQ PO TBCR: 10.0000 meq | EXTENDED_RELEASE_TABLET | Freq: Two times a day (BID) | ORAL | Status: DC

## 2014-05-20 MED ORDER — ONDANSETRON HCL 4 MG/2ML IJ SOLN
4.0000 mg | Freq: Once | INTRAMUSCULAR | Status: AC
  Administered 2014-05-20: 4 mg via INTRAVENOUS
  Filled 2014-05-20: qty 2

## 2014-05-20 MED ORDER — SODIUM CHLORIDE 0.9 % IV BOLUS (SEPSIS)
1000.0000 mL | Freq: Once | INTRAVENOUS | Status: AC
  Administered 2014-05-20: 1000 mL via INTRAVENOUS

## 2014-05-20 MED ORDER — MORPHINE SULFATE 4 MG/ML IJ SOLN
4.0000 mg | INTRAMUSCULAR | Status: DC | PRN
  Administered 2014-05-20: 4 mg via INTRAVENOUS
  Filled 2014-05-20: qty 1

## 2014-05-20 MED ORDER — SUCRALFATE 1 G PO TABS: 1.0000 g | ORAL_TABLET | Freq: Four times a day (QID) | ORAL | Status: DC

## 2014-05-20 MED ORDER — POTASSIUM CHLORIDE 10 MEQ/100ML IV SOLN
10.0000 meq | INTRAVENOUS | Status: AC
  Administered 2014-05-20 (×2): 10 meq via INTRAVENOUS
  Filled 2014-05-20 (×2): qty 100

## 2014-05-20 MED ORDER — PANTOPRAZOLE SODIUM 40 MG IV SOLR
40.0000 mg | Freq: Once | INTRAVENOUS | Status: AC
  Administered 2014-05-20: 40 mg via INTRAVENOUS
  Filled 2014-05-20: qty 40

## 2014-05-20 MED ORDER — OMEPRAZOLE 20 MG PO CPDR: 20.0000 mg | DELAYED_RELEASE_CAPSULE | Freq: Two times a day (BID) | ORAL | Status: DC

## 2014-05-20 MED ORDER — HYDROCODONE-ACETAMINOPHEN 5-325 MG PO TABS
1.0000 | ORAL_TABLET | ORAL | Status: DC | PRN
Start: 2014-05-20 — End: 2014-11-29

## 2014-05-20 NOTE — ED Notes (Signed)
MD at bedside. 

## 2014-05-20 NOTE — Discharge Instructions (Signed)
Avoid alcohol, tobacco, caffeine, and anti-inflammatory medications such as aspirin or ibuprofen. Small, frequent meals.  Abdominal Pain Many things can cause abdominal pain. Usually, abdominal pain is not caused by a disease and will improve without treatment. It can often be observed and treated at home. Your health care provider will do a physical exam and possibly order blood tests and X-rays to help determine the seriousness of your pain. However, in many cases, more time must pass before a clear cause of the pain can be found. Before that point, your health care provider may not know if you need more testing or further treatment. HOME CARE INSTRUCTIONS  Monitor your abdominal pain for any changes. The following actions may help to alleviate any discomfort you are experiencing:  Only take over-the-counter or prescription medicines as directed by your health care provider.  Do not take laxatives unless directed to do so by your health care provider.  Try a clear liquid diet (broth, tea, or water) as directed by your health care provider. Slowly move to a bland diet as tolerated. SEEK MEDICAL CARE IF:  You have unexplained abdominal pain.  You have abdominal pain associated with nausea or diarrhea.  You have pain when you urinate or have a bowel movement.  You experience abdominal pain that wakes you in the night.  You have abdominal pain that is worsened or improved by eating food.  You have abdominal pain that is worsened with eating fatty foods.  You have a fever. SEEK IMMEDIATE MEDICAL CARE IF:   Your pain does not go away within 2 hours.  You keep throwing up (vomiting).  Your pain is felt only in portions of the abdomen, such as the right side or the left lower portion of the abdomen.  You pass bloody or black tarry stools. MAKE SURE YOU:  Understand these instructions.   Will watch your condition.   Will get help right away if you are not doing well or get  worse.  Document Released: 12/15/2004 Document Revised: 03/12/2013 Document Reviewed: 11/14/2012 [facility] Patient Information 2015 [place], [place]. This information is not intended to replace advice given to you by your health care provider. Make sure you discuss any questions you have with your health care provider.  Peptic Ulcer A peptic ulcer is a sore in the lining of your esophagus (esophageal ulcer), stomach (gastric ulcer), or in the first part of your small intestine (duodenal ulcer). The ulcer causes erosion into the deeper tissue. CAUSES  Normally, the lining of the stomach and the small intestine protects itself from the acid that digests food. The protective lining can be damaged by:  An infection caused by a bacterium called Helicobacter pylori (H. pylori).  Regular use of nonsteroidal anti-inflammatory drugs (NSAIDs), such as ibuprofen or aspirin.  Smoking tobacco. Other risk factors include being older than 77, drinking alcohol excessively, and having a family history of ulcer disease.  SYMPTOMS   Burning pain or gnawing in the area between the chest and the belly button.  Heartburn.  Nausea and vomiting.  Bloating. The pain can be worse on an empty stomach and at night. If the ulcer results in bleeding, it can cause:  Black, tarry stools.  Vomiting of bright red blood.  Vomiting of coffee-ground-looking materials. DIAGNOSIS  A diagnosis is usually made based upon your history and an exam. Other tests and procedures may be performed to find the cause of the ulcer. Finding a cause will help determine the best treatment. Tests and  procedures may include:  Blood tests, stool tests, or breath tests to check for the bacterium H. pylori.  An upper gastrointestinal (GI) series of the esophagus, stomach, and small intestine.  An endoscopy to examine the esophagus, stomach, and small intestine.  A biopsy. TREATMENT  Treatment may include:  Eliminating the cause of  the ulcer, such as smoking, NSAIDs, or alcohol.  Medicines to reduce the amount of acid in your digestive tract.  Antibiotic medicines if the ulcer is caused by the H. pylori bacterium.  An upper endoscopy to treat a bleeding ulcer.  Surgery if the bleeding is severe or if the ulcer created a hole somewhere in the digestive system. HOME CARE INSTRUCTIONS   Avoid tobacco, alcohol, and caffeine. Smoking can increase the acid in the stomach, and continued smoking will impair the healing of ulcers.  Avoid foods and drinks that seem to cause discomfort or aggravate your ulcer.  Only take medicines as directed by your caregiver. Do not substitute over-the-counter medicines for prescription medicines without talking to your caregiver.  Keep any follow-up appointments and tests as directed. SEEK MEDICAL CARE IF:   Your do not improve within 7 days of starting treatment.  You have ongoing indigestion or heartburn. SEEK IMMEDIATE MEDICAL CARE IF:   You have sudden, sharp, or persistent abdominal pain.  You have bloody or dark black, tarry stools.  You vomit blood or vomit that looks like coffee grounds.  You become light-headed, weak, or feel faint.  You become sweaty or clammy. MAKE SURE YOU:   Understand these instructions.  Will watch your condition.  Will get help right away if you are not doing well or get worse. Document Released: 03/04/2000 Document Revised: 07/22/2013 Document Reviewed: 10/05/2011 [facility] Patient Information 2015 [place], [place]. This information is not intended to replace advice given to you by your health care provider. Make sure you discuss any questions you have with your health care provider.

## 2014-05-20 NOTE — ED Notes (Signed)
Pt placed on monitor.  

## 2014-05-20 NOTE — ED Notes (Signed)
CRITICAL VALUE ALERT  Critical value received:  1700  Date of notification:  05/20/2014  Time of notification:  1700  Critical value read back: YES  Nurse who received alert:  Charmayne Sheer, RN  MD notified (1st page):  MD Jeneen Rinks

## 2014-05-20 NOTE — ED Notes (Signed)
Patient c/o severe abd pain "behind belly button" and "legs giving out from under her." Per patient has had pain x1 year but has become more severe since last night. Patient reports nausea and vomiting. Unsure of fevers. Per family member patient has been in bed for past 2 weeks.
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
## 2014-05-23 ENCOUNTER — Emergency Department (HOSPITAL_COMMUNITY)
Admission: EM | Admit: 2014-05-23 | Discharge: 2014-05-23 | Discharge status: Against Medical Advice | Payer: Self-pay | Attending: Emergency Medicine | Admitting: Emergency Medicine

## 2014-05-23 ENCOUNTER — Encounter (HOSPITAL_COMMUNITY): Payer: Self-pay | Admitting: Emergency Medicine

## 2014-05-23 DIAGNOSIS — R109 Unspecified abdominal pain: Secondary | ICD-10-CM | POA: Insufficient documentation

## 2014-05-23 DIAGNOSIS — I2510 Atherosclerotic heart disease of native coronary artery without angina pectoris: Secondary | ICD-10-CM | POA: Insufficient documentation

## 2014-05-23 DIAGNOSIS — I10 Essential (primary) hypertension: Secondary | ICD-10-CM | POA: Insufficient documentation

## 2014-05-23 DIAGNOSIS — Z720 Tobacco use: Secondary | ICD-10-CM | POA: Insufficient documentation

## 2014-05-23 NOTE — ED Notes (Signed)
Notified by registration that patient threw her armband and left.

## 2014-05-23 NOTE — ED Notes (Signed)
Patient complaining of abdominal pain starting approximately 2 hours ago. States she has history of peptic ulcer. Denies vomiting or diarrhea. States patient was here Wednesday and was given hydrocodone for pain but medication is making abdominal pain worse.

## 2014-11-28 ENCOUNTER — Emergency Department (HOSPITAL_COMMUNITY): Payer: Self-pay

## 2014-11-28 ENCOUNTER — Emergency Department (HOSPITAL_COMMUNITY)
Admission: EM | Admit: 2014-11-28 | Discharge: 2014-11-29 | Disposition: A | Discharge status: Home / Self Care | Payer: Self-pay | Attending: Emergency Medicine | Admitting: Emergency Medicine

## 2014-11-28 ENCOUNTER — Encounter (HOSPITAL_COMMUNITY): Payer: Self-pay | Admitting: *Deleted

## 2014-11-28 DIAGNOSIS — E079 Disorder of thyroid, unspecified: Secondary | ICD-10-CM | POA: Insufficient documentation

## 2014-11-28 DIAGNOSIS — R0789 Other chest pain: Secondary | ICD-10-CM

## 2014-11-28 DIAGNOSIS — E876 Hypokalemia: Secondary | ICD-10-CM

## 2014-11-28 DIAGNOSIS — I10 Essential (primary) hypertension: Secondary | ICD-10-CM | POA: Insufficient documentation

## 2014-11-28 DIAGNOSIS — G40909 Epilepsy, unspecified, not intractable, without status epilepticus: Secondary | ICD-10-CM | POA: Insufficient documentation

## 2014-11-28 DIAGNOSIS — F329 Major depressive disorder, single episode, unspecified: Secondary | ICD-10-CM | POA: Insufficient documentation

## 2014-11-28 DIAGNOSIS — Z79899 Other long term (current) drug therapy: Secondary | ICD-10-CM | POA: Insufficient documentation

## 2014-11-28 DIAGNOSIS — Z720 Tobacco use: Secondary | ICD-10-CM | POA: Insufficient documentation

## 2014-11-28 DIAGNOSIS — I2510 Atherosclerotic heart disease of native coronary artery without angina pectoris: Secondary | ICD-10-CM | POA: Insufficient documentation

## 2014-11-28 DIAGNOSIS — M25512 Pain in left shoulder: Secondary | ICD-10-CM | POA: Insufficient documentation

## 2014-11-28 LAB — BASIC METABOLIC PANEL
ANION GAP: 10 (ref 5–15)
BUN: 15 mg/dL (ref 6–20)
CO2: 30 mmol/L (ref 22–32)
Calcium: 9.7 mg/dL (ref 8.9–10.3)
Chloride: 97 mmol/L — ABNORMAL LOW (ref 101–111)
Creatinine, Ser: 1.04 mg/dL — ABNORMAL HIGH (ref 0.44–1.00)
GFR calc Af Amer: >60 mL/min (ref >60)
GFR, EST NON AFRICAN AMERICAN: 57 mL/min — AB (ref >60)
GLUCOSE: 128 mg/dL — AB (ref 65–99)
Potassium: 3.3 mmol/L — ABNORMAL LOW (ref 3.5–5.1)
Sodium: 137 mmol/L (ref 135–145)

## 2014-11-28 LAB — TROPONIN I: Troponin I: <0.03 ng/mL (ref <0.031)

## 2014-11-28 LAB — CBC
HCT: 41.5 % (ref 36.0–46.0)
HEMOGLOBIN: 14.8 g/dL (ref 12.0–15.0)
MCH: 32.2 pg (ref 26.0–34.0)
MCHC: 35.7 g/dL (ref 30.0–36.0)
MCV: 90.2 fL (ref 78.0–100.0)
PLATELETS: 295 10*3/uL (ref 150–400)
RBC: 4.6 MIL/uL (ref 3.87–5.11)
RDW: 12.7 % (ref 11.5–15.5)
WBC: 9.9 10*3/uL (ref 4.0–10.5)

## 2014-11-28 NOTE — ED Notes (Signed)
Pt states she was laying in bed around 8:30 pm and pt felt a sharp,stabbing pain under her left breast and in her axilla. Pt unable to raise left arm above head. Pt c/o diaphoresis.

## 2014-11-29 MED ORDER — HYDROCODONE-ACETAMINOPHEN 5-325 MG PO TABS: 1.0000 | ORAL_TABLET | ORAL | Status: DC | PRN

## 2014-11-29 MED ORDER — HYDROCODONE-ACETAMINOPHEN 5-325 MG PO TABS
1.0000 | ORAL_TABLET | Freq: Once | ORAL | Status: AC
  Administered 2014-11-29: 1 via ORAL
  Filled 2014-11-29: qty 1

## 2014-11-29 MED ORDER — POTASSIUM CHLORIDE CRYS ER 20 MEQ PO TBCR
40.0000 meq | EXTENDED_RELEASE_TABLET | Freq: Once | ORAL | Status: AC
  Administered 2014-11-29: 40 meq via ORAL
  Filled 2014-11-29: qty 2

## 2014-11-29 MED ORDER — CELECOXIB 200 MG PO CAPS: 200.0000 mg | ORAL_CAPSULE | Freq: Two times a day (BID) | ORAL | Status: DC

## 2014-11-29 MED ORDER — IBUPROFEN 800 MG PO TABS: 800.0000 mg | ORAL_TABLET | Freq: Once | ORAL | Status: DC

## 2014-11-29 NOTE — ED Provider Notes (Signed)
CSN: [id number]     Arrival date & time 11/28/14  2052 History  This chart was scribed for Lori Fuel, MD by Starleen Arms, ED Scribe. This patient was seen in room APA07/APA07 and the patient's care was started at 12:40 AM.  Chief Complaint  Patient presents with  . Chest Pain   The history is provided by the patient. No language interpreter was used.    HPI Comments: [name] is a 59 y.o. female with hx of HTN, HLD who presents to the Emergency Department complaining of sharp, improving left-sided, currently 5/10, 10/10 at worst CP onset 4:00 pm.  At 6:00 pm the patient reports an inability to raise the left arm above the head onset 6:00 pm, lasting for several hours, currently resolved (no injury or recent change in activity).  Associated symptoms include nausea.  She also reports fatigue and decreased activity over the past month.  Patient is a current smoker.  She reports a family history of cardiac conditions in the mother (in 74s) and father.  She denies leg weakness, left hand weakness.     Past Medical History  Diagnosis Date  . Hypertension   . Seizures   . Depression   . Thyroid disease   . Coronary artery disease    Past Surgical History  Procedure Laterality Date  . Tubal ligation    . Appendectomy     History reviewed. No pertinent family history. Social History  Substance Use Topics  . Smoking status: Current Every Day Smoker -- 1.00 packs/day    Types: Cigarettes  . Smokeless tobacco: None  . Alcohol Use: No   OB History    Gravida Para Term Preterm AB TAB SAB Ectopic Multiple Living            4     Review of Systems A complete 10 system review of systems was obtained and all systems are negative except as noted in the HPI and PMH.   Allergies  Bee venom; Seroquel; Buspar; Wellbutrin; and Zoloft  Home Medications   Prior to Admission medications   Medication Sig Start Date End Date Taking? Authorizing Provider  acetaminophen (TYLENOL) 500 MG tablet  Take 500 mg by mouth every 6 (six) hours as needed for headache.    Historical Provider, MD  B-COMPLEX-C PO Take 1 tablet by mouth daily. Chewable-Gummies    Historical Provider, MD  diazepam (VALIUM) 5 MG tablet Take 5 mg by mouth at bedtime.    Historical Provider, MD  DULoxetine (CYMBALTA) 60 MG capsule Take 60 mg by mouth every evening.    Historical Provider, MD  HYDROcodone-acetaminophen (NORCO/VICODIN) 5-325 MG per tablet Take 1 tablet by mouth every 4 (four) hours as needed. 05/20/14   Tanna Furry, MD  levothyroxine (SYNTHROID, LEVOTHROID) 75 MCG tablet Take 75 mcg by mouth every evening.     Historical Provider, MD  lisinopril-hydrochlorothiazide (PRINZIDE,ZESTORETIC) 10-12.5 MG per tablet Take 1 tablet by mouth every evening.     Historical Provider, MD  Multiple Vitamins-Minerals (ADULT ONE DAILY GUMMIES PO) Take by mouth daily.    Historical Provider, MD  omeprazole (PRILOSEC) 20 MG capsule Take 1 capsule (20 mg total) by mouth 2 (two) times daily. 05/20/14   Tanna Furry, MD  potassium chloride (K-DUR) 10 MEQ tablet Take 1 tablet (10 mEq total) by mouth 2 (two) times daily. 05/20/14   Tanna Furry, MD  sucralfate (CARAFATE) 1 G tablet Take 1 tablet (1 g total) by mouth 4 (four) times daily.  05/20/14   Tanna Furry, MD  Vitamin D, Ergocalciferol, (DRISDOL) 50000 UNITS CAPS capsule Take 50,000 Units by mouth every 7 (seven) days.    Historical Provider, MD   BP 103/65 mmHg  Pulse 119  Temp(Src) 98.3 F (36.8 C) (Oral)  Resp 22  Ht 5\' 2"  (1.575 m)  Wt 134 lb (60.782 kg)  BMI 24.50 kg/m2  SpO2 99% Physical Exam  Constitutional: She is oriented to person, place, and time. She appears well-developed and well-nourished. No distress.  HENT:  Head: Normocephalic and atraumatic.  Eyes: Conjunctivae and EOM are normal. Pupils are equal, round, and reactive to light.  Neck: Normal range of motion. Neck supple. No JVD present.  Cardiovascular: Normal rate, regular rhythm and normal heart sounds.   No  murmur heard. Moderate tenderness left chest Humann.    Pulmonary/Chest: Effort normal and breath sounds normal. She has no wheezes. She has no rales. She exhibits tenderness.  Abdominal: Soft. Bowel sounds are normal. She exhibits no distension and no mass. There is no tenderness.  Musculoskeletal: Normal range of motion. She exhibits no edema.  Pain with full abduction of the left shoulder.   Lymphadenopathy:    She has no cervical adenopathy.  Neurological: She is alert and oriented to person, place, and time. No cranial nerve deficit. She exhibits normal muscle tone. Coordination normal.  Skin: Skin is warm and dry. No rash noted.  Psychiatric: She has a normal mood and affect. Her behavior is normal. Judgment and thought content normal.  Nursing note and vitals reviewed.   ED Course  Procedures (including critical care time)  DIAGNOSTIC STUDIES: Oxygen Saturation is 99% on RA, normal by my interpretation.    COORDINATION OF CARE:  12:52 AM Will order/prescribe pain medication and anti-inflammatory.  Patient acknowledges and agrees with plan.    Labs Review Labs Reviewed  BASIC METABOLIC PANEL - Abnormal; Notable for the following:    Potassium 3.3 (*)    Chloride 97 (*)    Glucose, Bld 128 (*)    Creatinine, Ser 1.04 (*)    GFR calc non Af Amer 57 (*)    All other components within normal limits  CBC  TROPONIN I    Imaging Review Dg Chest 2 View  11/28/2014   CLINICAL DATA:  59 year old female with chest pain  EXAM: CHEST  2 VIEW  COMPARISON:  Radiograph dated 11/12/2006  FINDINGS: The heart size and mediastinal contours are within normal limits. Both lungs are clear. The visualized skeletal structures are unremarkable.  IMPRESSION: No active cardiopulmonary disease.   Electronically Signed   By: Anner Crete M.D.   On: 11/28/2014 22:36   I have personally reviewed and evaluated these images and lab results as part of my medical decision-making.   EKG  Interpretation   Date/Time:  Friday November 28 2014 21:37:32 EDT Ventricular Rate:  114 PR Interval:  142 QRS Duration: 88 QT Interval:  328 QTC Calculation: 452 R Axis:   34 Text Interpretation:  Sinus tachycardia Possible Left atrial enlargement  Nonspecific ST abnormality Abnormal ECG When compared with ECG of  01/31/2013, No significant change was found Confirmed by [facility]  MD, Connor Meacham  ([id number]) on 11/29/2014 12:37:57 AM      MDM   Final diagnoses:  Chest [place] pain  Hypokalemia    Chest pain consistent with chest Gritton pain. However, patient does have significant risk factors and should have some kind of stress testing in the near future. Old records are reviewed  and she has no prior visits for chest pain. She is noted to be mildly hypokalemic and is given a dose of potassium in the emergency department. She does have history of ulcers, so will try her on celecoxib. She is also given prescription for hydrocodone-acetaminophen for breakthrough pain. Follow-up with PCP.  I personally performed the services described in this documentation, which was scribed in my presence. The recorded information has been reviewed and is accurate.      Lori Fuel, MD [id number]

## 2014-11-29 NOTE — Discharge Instructions (Signed)
Chest Humphres Pain Chest Bischoff pain is pain in or around the bones and muscles of your chest. It may take up to 6 weeks to get better. It may take longer if you must stay physically active in your work and activities.  CAUSES  Chest Craker pain may happen on its own. However, it may be caused by:  A viral illness like the flu.  Injury.  Coughing.  Exercise.  Arthritis.  Fibromyalgia.  Shingles. HOME CARE INSTRUCTIONS   Avoid overtiring physical activity. Try not to strain or perform activities that cause pain. This includes any activities using your chest or your abdominal and side muscles, especially if heavy weights are used.  Put ice on the sore area.  Put ice in a plastic bag.  Place a towel between your skin and the bag.  Leave the ice on for 15-20 minutes per hour while awake for the first 2 days.  Only take over-the-counter or prescription medicines for pain, discomfort, or fever as directed by your caregiver. SEEK IMMEDIATE MEDICAL CARE IF:   Your pain increases, or you are very uncomfortable.  You have a fever.  Your chest pain becomes worse.  You have new, unexplained symptoms.  You have nausea or vomiting.  You feel sweaty or lightheaded.  You have a cough with phlegm (sputum), or you cough up blood. MAKE SURE YOU:   Understand these instructions.  Will watch your condition.  Will get help right away if you are not doing well or get worse. Document Released: 03/07/2005 Document Revised: 05/30/2011 Document Reviewed: 11/01/2010 [facility] Patient Information 2015 [place], [place]. This information is not intended to replace advice given to you by your health care provider. Make sure you discuss any questions you have with your health care provider.   Hydrocodone tablets or capsules What is this medicine? ACETAMINOPHEN; HYDROCODONE (a set a MEE noe fen; hye droe KOE done) is a pain reliever. It is used to treat mild to moderate pain. This medicine may be  used for other purposes; ask your health care provider or pharmacist if you have questions. COMMON BRAND NAME(S): Anexsia, Bancap HC, Ceta-Plus, Co-Gesic, Comfortpak, Dolagesic, [employer], DuoCet, Hydrocet, Hydrogesic, [place], Lorcet HD, Lorcet Plus, Lortab, Margesic H, Maxidone, [place], Polygesic, [place], [place], [place], Vicodin, Vicodin ES, Vicodin HP, Charlane Ferretti What should I tell my health care provider before I take this medicine? They need to know if you have any of these conditions: -brain tumor -Crohn's disease, inflammatory bowel disease, or ulcerative colitis -drug abuse or addiction -head injury -heart or circulation problems -if you often drink alcohol -kidney disease or problems going to the bathroom -liver disease -lung disease, asthma, or breathing problems -an unusual or allergic reaction to acetaminophen, hydrocodone, other opioid analgesics, other medicines, foods, dyes, or preservatives -pregnant or trying to get pregnant -breast-feeding How should I use this medicine? Take this medicine by mouth. Swallow it with a full glass of water. Follow the directions on the prescription label. If the medicine upsets your stomach, take the medicine with food or milk. Do not take more than you are told to take. Talk to your pediatrician regarding the use of this medicine in children. This medicine is not approved for use in children. Overdosage: If you think you have taken too much of this medicine contact a poison control center or emergency room at once. NOTE: This medicine is only for you. Do not share this medicine with others. What if I miss a dose? If you miss a  dose, take it as soon as you can. If it is almost time for your next dose, take only that dose. Do not take double or extra doses. What may interact with this medicine? -alcohol -antihistamines -isoniazid -medicines for depression, anxiety, or psychotic disturbances -medicines for sleep -muscle  relaxants -naltrexone -narcotic medicines (opiates) for pain -phenobarbital -ritonavir -tramadol This list may not describe all possible interactions. Give your health care provider a list of all the medicines, herbs, non-prescription drugs, or dietary supplements you use. Also tell them if you smoke, drink alcohol, or use illegal drugs. Some items may interact with your medicine. What should I watch for while using this medicine? Tell your doctor or health care professional if your pain does not go away, if it gets worse, or if you have new or a different type of pain. You may develop tolerance to the medicine. Tolerance means that you will need a higher dose of the medicine for pain relief. Tolerance is normal and is expected if you take the medicine for a long time. Do not suddenly stop taking your medicine because you may develop a severe reaction. Your body becomes used to the medicine. This does NOT mean you are addicted. Addiction is a behavior related to getting and using a drug for a non-medical reason. If you have pain, you have a medical reason to take pain medicine. Your doctor will tell you how much medicine to take. If your doctor wants you to stop the medicine, the dose will be slowly lowered over time to avoid any side effects. You may get drowsy or dizzy when you first start taking the medicine or change doses. Do not drive, use machinery, or do anything that may be dangerous until you know how the medicine affects you. Stand or sit up slowly. There are different types of narcotic medicines (opiates) for pain. If you take more than one type at the same time, you may have more side effects. Give your health care provider a list of all medicines you use. Your doctor will tell you how much medicine to take. Do not take more medicine than directed. Call emergency for help if you have problems breathing. The medicine will cause constipation. Try to have a bowel movement at least every 2 to 3  days. If you do not have a bowel movement for 3 days, call your doctor or health care professional. Too much acetaminophen can be very dangerous. Do not take Tylenol (acetaminophen) or medicines that contain acetaminophen with this medicine. Many non-prescription medicines contain acetaminophen. Always read the labels carefully. What side effects may I notice from receiving this medicine? Side effects that you should report to your doctor or health care professional as soon as possible: -allergic reactions like skin rash, itching or hives, swelling of the face, lips, or tongue -breathing problems -confusion -feeling faint or lightheaded, falls -stomach pain -yellowing of the eyes or skin Side effects that usually do not require medical attention (report to your doctor or health care professional if they continue or are bothersome): -nausea, vomiting -stomach upset This list may not describe all possible side effects. Call your doctor for medical advice about side effects. You may report side effects to FDA at 1-800-FDA-1088. Where should I keep my medicine? Keep out of the reach of children. This medicine can be abused. Keep your medicine in a safe place to protect it from theft. Do not share this medicine with anyone. Selling or giving away this medicine is dangerous and against  the law. Store at room temperature between 15 and 30 degrees C (59 and 86 degrees F). Protect from light. Keep container tightly closed. Throw away any unused medicine after the expiration date. Discard unused medicine and used packaging carefully. Pets and children can be harmed if they find used or lost packages. NOTE: This sheet is a summary. It may not cover all possible information. If you have questions about this medicine, talk to your doctor, pharmacist, or health care provider.  2015, Elsevier/Gold Standard. (2012-10-29 13:15:56)  Celecoxib capsules What is this medicine? CELECOXIB (sell a KOX ib) is a  non-steroidal anti-inflammatory drug (NSAID). This medicine is used to treat arthritis and ankylosing spondylitis. It may be also used for pain or painful monthly periods. This medicine may be used for other purposes; ask your health care provider or pharmacist if you have questions. COMMON BRAND NAME(S): Celebrex What should I tell my health care provider before I take this medicine? They need to know if you have any of these conditions: -asthma -coronary artery bypass graft (CABG) surgery within the past 2 weeks -drink more than 3 alcohol-containing drinks a day -heart disease or circulation problems like heart failure or leg edema (fluid retention) -high blood pressure -kidney disease -liver disease -stomach bleeding or ulcers -an unusual or allergic reaction to celecoxib, sulfa drugs, aspirin, other NSAIDs, other medicines, foods, dyes, or preservatives -pregnant or trying to get pregnant -breast-feeding How should I use this medicine? Take this medicine by mouth with a full glass of water. Follow the directions on the prescription label. Take it with food if it upsets your stomach or if you take 400 mg at one time. Try to not lie down for at least 10 minutes after you take the medicine. Take the medicine at the same time each day. Do not take more medicine than you are told to take. Long-term, continuous use may increase the risk of heart attack or stroke. A special MedGuide will be given to you by the pharmacist with each prescription and refill. Be sure to read this information carefully each time. Talk to your pediatrician regarding the use of this medicine in children. Special care may be needed. Overdosage: If you think you have taken too much of this medicine contact a poison control center or emergency room at once. NOTE: This medicine is only for you. Do not share this medicine with others. What if I miss a dose? If you miss a dose, take it as soon as you can. If it is almost time  for your next dose, take only that dose. Do not take double or extra doses. What may interact with this medicine? Do not take this medicine with any of the following medications: -cidofovir -methotrexate -other NSAIDs, medicines for pain and inflammation, like ibuprofen or naproxen -pemetrexed This medicine may also interact with the following medications: -alcohol -aspirin and aspirin-like drugs -diuretics -fluconazole -lithium -medicines for high blood pressure -steroid medicines like prednisone or cortisone -warfarin This list may not describe all possible interactions. Give your health care provider a list of all the medicines, herbs, non-prescription drugs, or dietary supplements you use. Also tell them if you smoke, drink alcohol, or use illegal drugs. Some items may interact with your medicine. What should I watch for while using this medicine? Tell your doctor or health care professional if your pain does not get better. Talk to your doctor before taking another medicine for pain. Do not treat yourself. This medicine does not prevent heart attack  or stroke. In fact, this medicine may increase the chance of a heart attack or stroke. The chance may increase with longer use of this medicine and in people who have heart disease. If you take aspirin to prevent heart attack or stroke, talk with your doctor or health care professional. Do not take medicines such as ibuprofen and naproxen with this medicine. Side effects such as stomach upset, nausea, or ulcers may be more likely to occur. Many medicines available without a prescription should not be taken with this medicine. This medicine can cause ulcers and bleeding in the stomach and intestines at any time during treatment. Ulcers and bleeding can happen without warning symptoms and can cause death. What side effects may I notice from receiving this medicine? Side effects that you should report to your doctor or health care professional as  soon as possible: -allergic reactions like skin rash, itching or hives, swelling of the face, lips, or tongue -black or bloody stools, blood in the urine or vomit -blurred vision -breathing problems -chest pain -nausea, vomiting -problems with balance, talking, walking -redness, blistering, peeling or loosening of the skin, including inside the mouth -unexplained weight gain or swelling -unusually weak or tired -yellowing of eyes, skin Side effects that usually do not require medical attention (report to your doctor or health care professional if they continue or are bothersome): -constipation or diarrhea -dizziness -gas or heartburn -upset stomach This list may not describe all possible side effects. Call your doctor for medical advice about side effects. You may report side effects to FDA at 1-800-FDA-1088. Where should I keep my medicine? Keep out of the reach of children. Store at room temperature between 15 and 30 degrees C (59 and 86 degrees F). Keep container tightly closed. Throw away any unused medicine after the expiration date. NOTE: This sheet is a summary. It may not cover all possible information. If you have questions about this medicine, talk to your doctor, pharmacist, or health care provider.  2015, Elsevier/Gold Standard. (2009-05-06 10:54:17)

## 2015-08-07 ENCOUNTER — Encounter (HOSPITAL_COMMUNITY): Payer: Self-pay | Admitting: *Deleted

## 2015-08-07 ENCOUNTER — Emergency Department (HOSPITAL_COMMUNITY)
Admission: EM | Admit: 2015-08-07 | Discharge: 2015-08-08 | Disposition: A | Discharge status: Other Acute Inpatient Hospital | Payer: Self-pay | Attending: Emergency Medicine | Admitting: Emergency Medicine

## 2015-08-07 DIAGNOSIS — I2510 Atherosclerotic heart disease of native coronary artery without angina pectoris: Secondary | ICD-10-CM | POA: Insufficient documentation

## 2015-08-07 DIAGNOSIS — I10 Essential (primary) hypertension: Secondary | ICD-10-CM | POA: Insufficient documentation

## 2015-08-07 DIAGNOSIS — K279 Peptic ulcer, site unspecified, unspecified as acute or chronic, without hemorrhage or perforation: Secondary | ICD-10-CM | POA: Insufficient documentation

## 2015-08-07 DIAGNOSIS — Z79899 Other long term (current) drug therapy: Secondary | ICD-10-CM | POA: Insufficient documentation

## 2015-08-07 DIAGNOSIS — R45851 Suicidal ideations: Secondary | ICD-10-CM | POA: Insufficient documentation

## 2015-08-07 DIAGNOSIS — F17210 Nicotine dependence, cigarettes, uncomplicated: Secondary | ICD-10-CM | POA: Insufficient documentation

## 2015-08-07 DIAGNOSIS — R1013 Epigastric pain: Secondary | ICD-10-CM

## 2015-08-07 DIAGNOSIS — F329 Major depressive disorder, single episode, unspecified: Secondary | ICD-10-CM | POA: Insufficient documentation

## 2015-08-07 LAB — CBC
HCT: 41.7 % (ref 36.0–46.0)
Hemoglobin: 14.7 g/dL (ref 12.0–15.0)
MCH: 31.8 pg (ref 26.0–34.0)
MCHC: 35.3 g/dL (ref 30.0–36.0)
MCV: 90.3 fL (ref 78.0–100.0)
PLATELETS: 349 10*3/uL (ref 150–400)
RBC: 4.62 MIL/uL (ref 3.87–5.11)
RDW: 12.5 % (ref 11.5–15.5)
WBC: 11.9 10*3/uL — ABNORMAL HIGH (ref 4.0–10.5)

## 2015-08-07 LAB — SALICYLATE LEVEL

## 2015-08-07 LAB — RAPID URINE DRUG SCREEN, HOSP PERFORMED
AMPHETAMINES: NOT DETECTED
Barbiturates: NOT DETECTED
Benzodiazepines: NOT DETECTED
Cocaine: NOT DETECTED
OPIATES: NOT DETECTED
TETRAHYDROCANNABINOL: POSITIVE — AB

## 2015-08-07 LAB — COMPREHENSIVE METABOLIC PANEL
ALT: 37 U/L (ref 14–54)
AST: 39 U/L (ref 15–41)
Albumin: 4.6 g/dL (ref 3.5–5.0)
Alkaline Phosphatase: 64 U/L (ref 38–126)
Anion gap: 14 (ref 5–15)
BILIRUBIN TOTAL: 0.5 mg/dL (ref 0.3–1.2)
BUN: 14 mg/dL (ref 6–20)
CHLORIDE: 101 mmol/L (ref 101–111)
CO2: 22 mmol/L (ref 22–32)
CREATININE: 1.24 mg/dL — AB (ref 0.44–1.00)
Calcium: 9.8 mg/dL (ref 8.9–10.3)
GFR calc non Af Amer: 46 mL/min — ABNORMAL LOW (ref >60)
GFR, EST AFRICAN AMERICAN: 53 mL/min — AB (ref >60)
Glucose, Bld: 163 mg/dL — ABNORMAL HIGH (ref 65–99)
POTASSIUM: 3.1 mmol/L — AB (ref 3.5–5.1)
Sodium: 137 mmol/L (ref 135–145)
TOTAL PROTEIN: 8.4 g/dL — AB (ref 6.5–8.1)

## 2015-08-07 LAB — LIPASE, BLOOD: LIPASE: 26 U/L (ref 11–51)

## 2015-08-07 LAB — ETHANOL

## 2015-08-07 LAB — ACETAMINOPHEN LEVEL: Acetaminophen (Tylenol), Serum: <10 ug/mL — ABNORMAL LOW (ref 10–30)

## 2015-08-07 MED ORDER — FAMOTIDINE IN NACL 20-0.9 MG/50ML-% IV SOLN
20.0000 mg | Freq: Once | INTRAVENOUS | Status: AC
  Administered 2015-08-07: 20 mg via INTRAVENOUS
  Filled 2015-08-07: qty 50

## 2015-08-07 MED ORDER — POTASSIUM CHLORIDE CRYS ER 10 MEQ PO TBCR
10.0000 meq | EXTENDED_RELEASE_TABLET | Freq: Two times a day (BID) | ORAL | Status: DC
  Administered 2015-08-08: 10 meq via ORAL
  Filled 2015-08-07 (×5): qty 1

## 2015-08-07 MED ORDER — LEVOTHYROXINE SODIUM 50 MCG PO TABS
75.0000 ug | ORAL_TABLET | Freq: Every evening | ORAL | Status: DC
  Filled 2015-08-07: qty 2

## 2015-08-07 MED ORDER — GI COCKTAIL ~~LOC~~
30.0000 mL | Freq: Once | ORAL | Status: AC
  Administered 2015-08-07: 30 mL via ORAL
  Filled 2015-08-07: qty 30

## 2015-08-07 MED ORDER — SODIUM CHLORIDE 0.9 % IV BOLUS (SEPSIS)
1000.0000 mL | Freq: Once | INTRAVENOUS | Status: AC
  Administered 2015-08-07: 1000 mL via INTRAVENOUS

## 2015-08-07 MED ORDER — ONDANSETRON HCL 4 MG PO TABS: 4.0000 mg | ORAL_TABLET | Freq: Three times a day (TID) | ORAL | Status: DC | PRN

## 2015-08-07 MED ORDER — POTASSIUM CHLORIDE CRYS ER 20 MEQ PO TBCR
40.0000 meq | EXTENDED_RELEASE_TABLET | Freq: Once | ORAL | Status: AC
  Administered 2015-08-07: 40 meq via ORAL
  Filled 2015-08-07: qty 2

## 2015-08-07 MED ORDER — HYDROCHLOROTHIAZIDE 12.5 MG PO CAPS
12.5000 mg | ORAL_CAPSULE | Freq: Every day | ORAL | Status: DC
Start: 2015-08-08 — End: 2015-08-08
  Administered 2015-08-08: 12.5 mg via ORAL
  Filled 2015-08-07 (×3): qty 1

## 2015-08-07 MED ORDER — LISINOPRIL 10 MG PO TABS
10.0000 mg | ORAL_TABLET | Freq: Every day | ORAL | Status: DC
  Administered 2015-08-08: 10 mg via ORAL
  Filled 2015-08-07: qty 1

## 2015-08-07 MED ORDER — SUCRALFATE 1 G PO TABS
ORAL_TABLET | ORAL | Status: AC
  Administered 2015-08-07: 1 g via ORAL
  Filled 2015-08-07: qty 1

## 2015-08-07 MED ORDER — ACETAMINOPHEN 325 MG PO TABS: 650.0000 mg | ORAL_TABLET | ORAL | Status: DC | PRN

## 2015-08-07 MED ORDER — PRAVASTATIN SODIUM 10 MG PO TABS
20.0000 mg | ORAL_TABLET | Freq: Every day | ORAL | Status: DC
  Administered 2015-08-07: 20 mg via ORAL
  Filled 2015-08-07 (×3): qty 1

## 2015-08-07 MED ORDER — SUCRALFATE 1 G PO TABS
1.0000 g | ORAL_TABLET | Freq: Four times a day (QID) | ORAL | Status: DC
  Administered 2015-08-07 – 2015-08-08 (×4): 1 g via ORAL
  Filled 2015-08-07 (×3): qty 1

## 2015-08-07 MED ORDER — PANTOPRAZOLE SODIUM 40 MG PO TBEC
40.0000 mg | DELAYED_RELEASE_TABLET | Freq: Every day | ORAL | Status: DC
  Administered 2015-08-07 – 2015-08-08 (×2): 40 mg via ORAL
  Filled 2015-08-07 (×2): qty 1

## 2015-08-07 MED ORDER — LORAZEPAM 1 MG PO TABS: 1.0000 mg | ORAL_TABLET | Freq: Three times a day (TID) | ORAL | Status: DC | PRN

## 2015-08-07 MED ORDER — ALUM & MAG HYDROXIDE-SIMETH 200-200-20 MG/5ML PO SUSP
30.0000 mL | ORAL | Status: DC | PRN
  Administered 2015-08-07: 30 mL via ORAL
  Filled 2015-08-07: qty 30

## 2015-08-07 MED ORDER — VITAMIN D (ERGOCALCIFEROL) 1.25 MG (50000 UNIT) PO CAPS
50000.0000 [IU] | ORAL_CAPSULE | ORAL | Status: DC
  Filled 2015-08-07 (×2): qty 1

## 2015-08-07 MED ORDER — LAMOTRIGINE 25 MG PO TABS
25.0000 mg | ORAL_TABLET | Freq: Every day | ORAL | Status: DC
  Administered 2015-08-08: 25 mg via ORAL
  Filled 2015-08-07 (×2): qty 1

## 2015-08-07 MED ORDER — NORTRIPTYLINE HCL 25 MG PO CAPS
ORAL_CAPSULE | ORAL | Status: AC
  Filled 2015-08-07: qty 1

## 2015-08-07 MED ORDER — DIAZEPAM 5 MG PO TABS
5.0000 mg | ORAL_TABLET | Freq: Every day | ORAL | Status: DC
  Administered 2015-08-07: 5 mg via ORAL
  Filled 2015-08-07: qty 1

## 2015-08-07 MED ORDER — LISINOPRIL-HYDROCHLOROTHIAZIDE 10-12.5 MG PO TABS: 1.0000 | ORAL_TABLET | Freq: Every day | ORAL | Status: DC

## 2015-08-07 MED ORDER — CELECOXIB 100 MG PO CAPS
200.0000 mg | ORAL_CAPSULE | Freq: Two times a day (BID) | ORAL | Status: DC
  Filled 2015-08-07 (×6): qty 1

## 2015-08-07 MED ORDER — NORTRIPTYLINE HCL 25 MG PO CAPS
25.0000 mg | ORAL_CAPSULE | Freq: Every day | ORAL | Status: DC
  Administered 2015-08-07: 25 mg via ORAL
  Filled 2015-08-07 (×3): qty 1

## 2015-08-07 NOTE — ED Provider Notes (Signed)
CSN: [id number]     Arrival date & time 08/07/15  1201 History   First MD Initiated Contact with Patient 08/07/15 1214     Chief Complaint  Patient presents with  . Psych eval      (Consider location/radiation/quality/duration/timing/severity/associated sxs/prior Treatment) HPI 60 year old female who presents with suicidal ideation. She has a history of hypertension, depression, CAD, peptic ulcer disease. States that she has had increasing depression over the past several weeks. States that over the past year she has been moving between states, and has been dealing with homelessness. States that during the summer she had her Cymbalta discontinued when she was in a different state, and was started on lamotrigine. With the increased stressors she has noticed that she has had worsening suicidal thoughts. Recently has been having thoughts of wanting to overdose on all of her blood pressure medications. No homicidal thoughts, visual or auditory hallucinations, alcohol abuse, or illicit drug use. States that she has been having epigastric abdominal pain, worsened after eating. This is similar to pain when she has had peptic ulcer disease. No melena or hematochezia. Past Medical History  Diagnosis Date  . Hypertension   . Seizures ([place])   . Depression   . Thyroid disease   . Coronary artery disease    Past Surgical History  Procedure Laterality Date  . Tubal ligation    . Appendectomy     No family history on file. Social History  Substance Use Topics  . Smoking status: Current Every Day Smoker -- 1.00 packs/day    Types: Cigarettes  . Smokeless tobacco: None  . Alcohol Use: No   OB History    Gravida Para Term Preterm AB TAB SAB Ectopic Multiple Living            4     Review of Systems 10/14 systems reviewed and are negative other than those stated in the HPI   Allergies  Bee venom; Seroquel; Buspar; Wellbutrin; and Zoloft  Home Medications   Prior to Admission medications    Medication Sig Start Date End Date Taking? Authorizing Provider  acetaminophen (TYLENOL) 500 MG tablet Take 500 mg by mouth every 6 (six) hours as needed for headache.    Historical Provider, MD  B-COMPLEX-C PO Take 1 tablet by mouth daily. Chewable-Gummies    Historical Provider, MD  celecoxib (CELEBREX) 200 MG capsule Take 1 capsule (200 mg total) by mouth 2 (two) times daily. [id number]   Delora Fuel, MD  diazepam (VALIUM) 5 MG tablet Take 5 mg by mouth at bedtime.    Historical Provider, MD  DULoxetine (CYMBALTA) 60 MG capsule Take 60 mg by mouth every evening.    Historical Provider, MD  HYDROcodone-acetaminophen (NORCO) 5-325 MG per tablet Take 1 tablet by mouth every 4 (four) hours as needed for moderate pain. [id number]   Delora Fuel, MD  levothyroxine (SYNTHROID, LEVOTHROID) 75 MCG tablet Take 75 mcg by mouth every evening.     Historical Provider, MD  lisinopril-hydrochlorothiazide (PRINZIDE,ZESTORETIC) 10-12.5 MG per tablet Take 1 tablet by mouth daily.     Historical Provider, MD  Multiple Vitamins-Minerals (ADULT ONE DAILY GUMMIES PO) Take by mouth daily.    Historical Provider, MD  omeprazole (PRILOSEC) 20 MG capsule Take 1 capsule (20 mg total) by mouth 2 (two) times daily. 05/20/14   Tanna Furry, MD  potassium chloride (K-DUR) 10 MEQ tablet Take 1 tablet (10 mEq total) by mouth 2 (two) times daily. 05/20/14   Tanna Furry, MD  sucralfate ([place])  1 G tablet Take 1 tablet (1 g total) by mouth 4 (four) times daily. 05/20/14   Tanna Furry, MD  Vitamin D, Ergocalciferol, (DRISDOL) 50000 UNITS CAPS capsule Take 50,000 Units by mouth every 7 (seven) days.    Historical Provider, MD   BP 135/83 mmHg  Pulse 82  Temp(Src) 99.3 F (37.4 C) (Oral)  Resp 16  Ht 5\' 2"  (1.575 m)  Wt 130 lb (58.968 kg)  BMI 23.77 kg/m2  SpO2 96% Physical Exam Physical Exam  Nursing note and vitals reviewed. Constitutional: Well developed, well nourished, non-toxic, and in no acute distress Head: Normocephalic and  atraumatic.  Mouth/Throat: Oropharynx is clear and moist.  Neck: Normal range of motion. Neck supple.  Cardiovascular: Normal rate and regular rhythm.   Pulmonary/Chest: Effort normal and breath sounds normal.  Abdominal: Soft. There is epigastric tenderness. There is no rebound and no guarding.  Musculoskeletal: Normal range of motion.  Neurological: Alert, no facial droop, fluent speech, moves all extremities symmetrically Skin: Skin is warm and dry.  Psychiatric: Cooperative   ED Course  Procedures (including critical care time) Labs Review Labs Reviewed  COMPREHENSIVE METABOLIC PANEL - Abnormal; Notable for the following:    Potassium 3.1 (*)    Glucose, Bld 163 (*)    Creatinine, Ser 1.24 (*)    Total Protein 8.4 (*)    GFR calc non Af Amer 46 (*)    GFR calc Af Amer 53 (*)    All other components within normal limits  ACETAMINOPHEN LEVEL - Abnormal; Notable for the following:    Acetaminophen (Tylenol), Serum <10 (*)    All other components within normal limits  CBC - Abnormal; Notable for the following:    WBC 11.9 (*)    All other components within normal limits  URINE RAPID DRUG SCREEN, HOSP PERFORMED - Abnormal; Notable for the following:    Tetrahydrocannabinol POSITIVE (*)    All other components within normal limits  ETHANOL  SALICYLATE LEVEL  LIPASE, BLOOD    Imaging Review No results found. I have personally reviewed and evaluated these images and lab results as part of my medical decision-making.   EKG Interpretation None      MDM   Final diagnoses:  Suicide ideation  PUD (peptic ulcer disease)  Epigastric abdominal pain    60 year old female with history of depression who presents with increasing suicidal ideation with plan. She is tearful on presentation, but nontoxic in no acute distress. Her vital signs are within normal limits. She has a soft and benign belly, but with some epigastric tenderness to palpation. Negative Murphy sign, and  presentation seems consistent with that of possible peptic ulcer disease. No evidence of GI bleeding, and pain resolved after Pepcid and GI cocktail. CBC, LFTs, lipase are unremarkable. Tox screen is notable for [facility]. She has mild hypokalemia of 3.1 and given 40 mEq of oral potassium for repletion. She has had issues with potassium in the past. Mild AKI as well and is given 1 L of IV fluids. At this time she is felt to be medically cleared for TTS evaluation.    Forde Dandy, MD 08/07/15 [phone]

## 2015-08-07 NOTE — [facility] Counselor (Signed)
This Probation officer faxed out supporting documentation to the following psych facilities for patient placement: [place], [place]

## 2015-08-07 NOTE — ED Notes (Signed)
PAtient complaining of abdominal pain.  See MAR.

## 2015-08-07 NOTE — ED Notes (Signed)
Pt states she had a change in mediation. Pt is no longer taking Cymbalta, she was placed on another medication and has been having suicidial thoughts for around 1 month. States she was going to take all of her blood pressure medication at one time. Denies any HI. States she does not have any AVH. However, she states she does feel paranoid like people are going to break into her house.
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
## 2015-08-08 ENCOUNTER — Encounter (HOSPITAL_COMMUNITY): Payer: Self-pay

## 2015-08-08 ENCOUNTER — Inpatient Hospital Stay (HOSPITAL_COMMUNITY)
Admission: AD | Admit: 2015-08-08 | Discharge: 2015-08-11 | DRG: 885 | Disposition: A | Discharge status: Home / Self Care | Payer: No Typology Code available for payment source | Source: Intra-hospital | Attending: Psychiatry | Admitting: Psychiatry

## 2015-08-08 DIAGNOSIS — K219 Gastro-esophageal reflux disease without esophagitis: Secondary | ICD-10-CM | POA: Diagnosis present

## 2015-08-08 DIAGNOSIS — F410 Panic disorder [episodic paroxysmal anxiety] without agoraphobia: Secondary | ICD-10-CM | POA: Diagnosis present

## 2015-08-08 DIAGNOSIS — Z818 Family history of other mental and behavioral disorders: Secondary | ICD-10-CM | POA: Diagnosis not present

## 2015-08-08 DIAGNOSIS — R45851 Suicidal ideations: Secondary | ICD-10-CM | POA: Diagnosis present

## 2015-08-08 DIAGNOSIS — I10 Essential (primary) hypertension: Secondary | ICD-10-CM | POA: Diagnosis present

## 2015-08-08 DIAGNOSIS — F17210 Nicotine dependence, cigarettes, uncomplicated: Secondary | ICD-10-CM | POA: Diagnosis present

## 2015-08-08 DIAGNOSIS — F332 Major depressive disorder, recurrent severe without psychotic features: Secondary | ICD-10-CM | POA: Diagnosis present

## 2015-08-08 DIAGNOSIS — F314 Bipolar disorder, current episode depressed, severe, without psychotic features: Secondary | ICD-10-CM | POA: Diagnosis not present

## 2015-08-08 DIAGNOSIS — Z79899 Other long term (current) drug therapy: Secondary | ICD-10-CM | POA: Diagnosis not present

## 2015-08-08 DIAGNOSIS — Z833 Family history of diabetes mellitus: Secondary | ICD-10-CM

## 2015-08-08 MED ORDER — ACETAMINOPHEN 325 MG PO TABS
650.0000 mg | ORAL_TABLET | Freq: Four times a day (QID) | ORAL | Status: DC | PRN
  Administered 2015-08-08: 650 mg via ORAL
  Filled 2015-08-08: qty 2

## 2015-08-08 MED ORDER — HYDROCHLOROTHIAZIDE 12.5 MG PO CAPS
12.5000 mg | ORAL_CAPSULE | Freq: Every day | ORAL | Status: DC
  Administered 2015-08-09 – 2015-08-11 (×3): 12.5 mg via ORAL
  Filled 2015-08-08 (×5): qty 1

## 2015-08-08 MED ORDER — ALUM & MAG HYDROXIDE-SIMETH 200-200-20 MG/5ML PO SUSP: 30.0000 mL | ORAL | Status: DC | PRN

## 2015-08-08 MED ORDER — NORTRIPTYLINE HCL 25 MG PO CAPS
25.0000 mg | ORAL_CAPSULE | Freq: Every day | ORAL | Status: DC
  Administered 2015-08-08: 25 mg via ORAL
  Filled 2015-08-08 (×4): qty 1

## 2015-08-08 MED ORDER — LAMOTRIGINE 25 MG PO TABS
25.0000 mg | ORAL_TABLET | Freq: Every day | ORAL | Status: DC
  Administered 2015-08-09: 25 mg via ORAL
  Filled 2015-08-08 (×3): qty 1

## 2015-08-08 MED ORDER — ENSURE ENLIVE PO LIQD
237.0000 mL | Freq: Two times a day (BID) | ORAL | Status: DC
  Administered 2015-08-09: 237 mL via ORAL

## 2015-08-08 MED ORDER — POTASSIUM CHLORIDE CRYS ER 10 MEQ PO TBCR
10.0000 meq | EXTENDED_RELEASE_TABLET | Freq: Two times a day (BID) | ORAL | Status: DC
  Administered 2015-08-08 – 2015-08-11 (×6): 10 meq via ORAL
  Filled 2015-08-08 (×12): qty 1

## 2015-08-08 MED ORDER — MAGNESIUM HYDROXIDE 400 MG/5ML PO SUSP: 30.0000 mL | Freq: Every day | ORAL | Status: DC | PRN

## 2015-08-08 MED ORDER — LORAZEPAM 1 MG PO TABS
1.0000 mg | ORAL_TABLET | Freq: Three times a day (TID) | ORAL | Status: DC | PRN
  Administered 2015-08-10: 1 mg via ORAL
  Filled 2015-08-08: qty 1

## 2015-08-08 MED ORDER — PRAVASTATIN SODIUM 20 MG PO TABS
20.0000 mg | ORAL_TABLET | Freq: Every day | ORAL | Status: DC
  Administered 2015-08-09 – 2015-08-10 (×2): 20 mg via ORAL
  Filled 2015-08-08 (×4): qty 1

## 2015-08-08 MED ORDER — SUCRALFATE 1 G PO TABS
1.0000 g | ORAL_TABLET | Freq: Four times a day (QID) | ORAL | Status: DC
  Administered 2015-08-08 – 2015-08-11 (×12): 1 g via ORAL
  Filled 2015-08-08 (×21): qty 1

## 2015-08-08 MED ORDER — DIAZEPAM 5 MG PO TABS
5.0000 mg | ORAL_TABLET | Freq: Every day | ORAL | Status: DC
  Administered 2015-08-08: 5 mg via ORAL
  Filled 2015-08-08: qty 1

## 2015-08-08 MED ORDER — NICOTINE 21 MG/24HR TD PT24
21.0000 mg | MEDICATED_PATCH | Freq: Once | TRANSDERMAL | Status: DC
  Administered 2015-08-08: 21 mg via TRANSDERMAL

## 2015-08-08 MED ORDER — VITAMIN D (ERGOCALCIFEROL) 1.25 MG (50000 UNIT) PO CAPS: 50000.0000 [IU] | ORAL_CAPSULE | ORAL | Status: DC

## 2015-08-08 MED ORDER — NICOTINE 14 MG/24HR TD PT24: 14.0000 mg | MEDICATED_PATCH | Freq: Once | TRANSDERMAL | Status: DC

## 2015-08-08 MED ORDER — ONDANSETRON HCL 4 MG PO TABS: 4.0000 mg | ORAL_TABLET | Freq: Three times a day (TID) | ORAL | Status: DC | PRN

## 2015-08-08 MED ORDER — LISINOPRIL 10 MG PO TABS
10.0000 mg | ORAL_TABLET | Freq: Every day | ORAL | Status: DC
  Administered 2015-08-09 – 2015-08-11 (×3): 10 mg via ORAL
  Filled 2015-08-08 (×5): qty 1

## 2015-08-08 MED ORDER — NICOTINE 21 MG/24HR TD PT24
21.0000 mg | MEDICATED_PATCH | Freq: Once | TRANSDERMAL | Status: DC
  Filled 2015-08-08: qty 1

## 2015-08-08 MED ORDER — PANTOPRAZOLE SODIUM 40 MG PO TBEC
40.0000 mg | DELAYED_RELEASE_TABLET | Freq: Every day | ORAL | Status: DC
  Administered 2015-08-09 – 2015-08-11 (×3): 40 mg via ORAL
  Filled 2015-08-08 (×5): qty 1

## 2015-08-08 MED ORDER — ALUM & MAG HYDROXIDE-SIMETH 200-200-20 MG/5ML PO SUSP
30.0000 mL | ORAL | Status: DC | PRN
  Administered 2015-08-09 – 2015-08-11 (×3): 30 mL via ORAL
  Filled 2015-08-08 (×3): qty 30

## 2015-08-08 MED ORDER — LEVOTHYROXINE SODIUM 75 MCG PO TABS
75.0000 ug | ORAL_TABLET | Freq: Every evening | ORAL | Status: DC
  Administered 2015-08-08 – 2015-08-10 (×3): 75 ug via ORAL
  Filled 2015-08-08 (×7): qty 1
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
## 2015-08-08 NOTE — ED Notes (Signed)
[facility] arrived to transport patient. Pt calm and cooperative upon departure.

## 2015-08-08 NOTE — ED Notes (Signed)
Spoke with [name] from [facility] and updated on pt. States that they should have a bed available for her at [facility] later this afternoon. Will notify us when one is ready. Pt informed of plan. Pt remains voluntary.

## 2015-08-08 NOTE — ED Notes (Signed)
Pt's family arrived to visit patient. 

## 2015-08-08 NOTE — ED Provider Notes (Signed)
  Physical Exam  BP 114/70 mmHg  Pulse 88  Temp(Src) 98 F (36.7 C) (Oral)  Resp 18  Ht 5\' 2"  (1.575 m)  Wt 130 lb (58.968 kg)  BMI 23.77 kg/m2  SpO2 96%  Physical Exam  ED Course  Procedures  MDM Accept at [facility] by Dr Parke Poisson      Davonna Belling, MD 08/08/15 [phone]

## 2015-08-08 NOTE — Progress Notes (Signed)
Adult Psychoeducational Group Note  Date:  08/08/2015 Time:  9:39 PM  Group Topic/Focus:  Wrap-Up Group:   The focus of this group is to help patients review their daily goal of treatment and discuss progress on daily workbooks.  Participation Level:  Active  Participation Quality:  Appropriate  Affect:  Appropriate  Cognitive:  Alert  Insight: Appropriate  Engagement in Group:  Engaged  Modes of Intervention:  Discussion  Additional Comments:  Pt is new to the unit. Two positive coping skills include: deep breathing and art.  Sharmon Revere 08/08/2015, 9:39 PM

## 2015-08-08 NOTE — ED Notes (Signed)
Pt has no belongings at our facility. States that she sent her belongings home with her husband. Pt requesting to change into new scrubs. New scrubs given per request. Waiting on [facility] to transport to [facility].
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
## 2015-08-08 NOTE — B[facility] Group Notes (Signed)
[place] LCSW Group Therapy Note 08/08/2015 at 10:10 AM  Type of Therapy and Topic:  Group Therapy: Avoiding Self-Sabotaging and Enabling Behaviors  Participation Level:  Did Not Attend although encouraged to do so prior to group    Sheilah Pigeon, LCSW

## 2015-08-08 NOTE — ED Notes (Signed)
Pt given lunch meal tray. 

## 2015-08-09 ENCOUNTER — Encounter (HOSPITAL_COMMUNITY): Payer: Self-pay | Admitting: Psychiatry

## 2015-08-09 DIAGNOSIS — F314 Bipolar disorder, current episode depressed, severe, without psychotic features: Secondary | ICD-10-CM | POA: Diagnosis present

## 2015-08-09 MED ORDER — GI COCKTAIL ~~LOC~~
30.0000 mL | Freq: Once | ORAL | Status: AC
  Administered 2015-08-09: 30 mL via ORAL
  Filled 2015-08-09 (×2): qty 30

## 2015-08-09 MED ORDER — DIVALPROEX SODIUM 250 MG PO DR TAB
250.0000 mg | DELAYED_RELEASE_TABLET | Freq: Three times a day (TID) | ORAL | Status: DC
  Administered 2015-08-09 – 2015-08-10 (×3): 250 mg via ORAL
  Filled 2015-08-09 (×7): qty 1

## 2015-08-09 MED ORDER — HYDROXYZINE HCL 50 MG PO TABS: 50.0000 mg | ORAL_TABLET | Freq: Once | ORAL | Status: DC

## 2015-08-09 MED ORDER — NICOTINE 21 MG/24HR TD PT24
21.0000 mg | MEDICATED_PATCH | Freq: Every day | TRANSDERMAL | Status: DC
  Administered 2015-08-09 – 2015-08-11 (×3): 21 mg via TRANSDERMAL
  Filled 2015-08-09 (×4): qty 1

## 2015-08-09 MED ORDER — MIRTAZAPINE 7.5 MG PO TABS
7.5000 mg | ORAL_TABLET | Freq: Every day | ORAL | Status: DC
  Administered 2015-08-09: 7.5 mg via ORAL
  Filled 2015-08-09 (×2): qty 1

## 2015-08-09 NOTE — H&P (Signed)
Psychiatric Admission Assessment Adult  Patient Identification: [name] MRN:  [id number] Date of Evaluation:  08/09/2015 Chief Complaint:  MDD-RECURENT SEVERE Principal Diagnosis: Bipolar disorder with severe depression ([place]) Diagnosis:   Patient Active Problem List   Diagnosis Date Noted  . Bipolar disorder with severe depression ([place]) [F31.4] 08/09/2015   History of Present Illness: Per tele-assessment [name] is a 60 y.o. female who voluntarily presented to [facility] with c/o SI with a plan to OD on her blood pressure medications. Pt disclosed to writer that she just said that she had a plan to OD so she could get help but was unclear on what help she felt she needed. Pt appeared pre-occupied with the fact that she was d/c from Cymbalta @ 07/2014 and repeatedly indicated that she wanted to be restarted on it, b/c it was the only med that worked for her after many other trials over a 20 year period. Pt reported having panic attacks daily and being depressed since the d/c of the Cymbalta. Pt reported not having SI since being in the hospital and getting fluids in her system. Pt was contradictory in her information and a little hard to follow.   Although pt currently denies SI, she has several risk factors that warrant an IP hospitalization, including: (a) she reported SI with a plan to OD a couple of hours prior to assessment; (b) pt has a hx of multiple suicide attempts and a family hx of the same; (c) pt has no family supports, by her own admission, and (d) pt reports a tumultuous relationship with her husband.  Associated Signs/Symptoms: Depression Symptoms:  depressed mood, hopelessness, anxiety, (Hypo) Manic Symptoms:  Distractibility, Irritable Mood, Anxiety Symptoms:  Excessive Worry, Psychotic Symptoms:  Hallucinations: None PTSD Symptoms: Avoidance:  Decreased Interest/Participation Total Time spent with patient: 30 minutes  Past Psychiatric History: See Above  Is the  patient at risk to self? Yes.    Has the patient been a risk to self in the past 6 months? Yes.    Has the patient been a risk to self within the distant past? No.  Is the patient a risk to others? No.  Has the patient been a risk to others in the past 6 months? No.  Has the patient been a risk to others within the distant past? No.   Prior Inpatient Therapy:   Prior Outpatient Therapy:    Alcohol Screening: 1. How often do you have a drink containing alcohol?: Never 9. Have you or someone else been injured as a result of your drinking?: No 10. Has a relative or friend or a doctor or another health worker been concerned about your drinking or suggested you cut down?: No Alcohol Use Disorder Identification Test Final Score (AUDIT): 0 Brief Intervention: AUDIT score less than 7 or less-screening does not suggest unhealthy drinking-brief intervention not indicated Substance Abuse History in the last 12 months:  Yes.   Consequences of Substance Abuse: Withdrawal Symptoms:   None Previous Psychotropic Medications:  Psychological Evaluations: YES Past Medical History:  Past Medical History  Diagnosis Date  . Hypertension   . Seizures ([place])   . Depression   . Thyroid disease   . Coronary artery disease     Past Surgical History  Procedure Laterality Date  . Tubal ligation    . Appendectomy     Family History:  Family History  Problem Relation Age of Onset  . Diabetes Mother   . Depression Maternal Uncle  Family Psychiatric  History:Mother: Depression, Uncles: Suicidal/depression Tobacco Screening: @FLOW ([phone])::1)@ Social History:  History  Alcohol Use No     History  Drug Use No    Additional Social History:                           Allergies:   Allergies  Allergen Reactions  . Ambien [Zolpidem Tartrate]   . Bee Venom Nausea And Vomiting and Swelling  . Pamelor [Nortriptyline Hcl]     suicidal  . Seroquel [Quetiapine Fumarate] Swelling  .  Trazodone And Nefazodone   . Buspar [Buspirone] Palpitations  . Wellbutrin [Bupropion] Palpitations  . Zoloft [Sertraline Hcl] Palpitations   Lab Results:  No results found for this or any previous visit (from the past 48 hour(s)).  Blood Alcohol level:  Lab Results  Component Value Date   ETH <5 08/07/2015   ETH <11 [id number]    Metabolic Disorder Labs:  No results found for: HGBA1C, MPG No results found for: PROLACTIN No results found for: CHOL, TRIG, HDL, CHOLHDL, VLDL, LDLCALC  Current Medications: Current Facility-Administered Medications  Medication Dose Route Frequency Provider Last Rate Last Dose  . acetaminophen (TYLENOL) tablet 650 mg  650 mg Oral Q6H PRN Niel Hummer, NP   650 mg at 08/08/15 1936  . alum & mag hydroxide-simeth (MAALOX/MYLANTA) 200-200-20 MG/5ML suspension 30 mL  30 mL Oral Q4H PRN Niel Hummer, NP      . divalproex (DEPAKOTE) DR tablet 250 mg  250 mg Oral Q8H Saramma Eappen, MD   250 mg at 08/09/15 1349  . feeding supplement (ENSURE ENLIVE) (ENSURE ENLIVE) liquid 237 mL  237 mL Oral BID BM Niel Hummer, NP   237 mL at 08/09/15 1000  . lisinopril (PRINIVIL,ZESTRIL) tablet 10 mg  10 mg Oral Daily Niel Hummer, NP   10 mg at 08/09/15 0820   And  . hydrochlorothiazide (MICROZIDE) capsule 12.5 mg  12.5 mg Oral Daily Niel Hummer, NP   12.5 mg at 08/09/15 0820  . levothyroxine (SYNTHROID, LEVOTHROID) tablet 75 mcg  75 mcg Oral QPM Niel Hummer, NP   75 mcg at 08/08/15 1856  . LORazepam (ATIVAN) tablet 1 mg  1 mg Oral Q8H PRN Niel Hummer, NP      . magnesium hydroxide (MILK OF MAGNESIA) suspension 30 mL  30 mL Oral Daily PRN Niel Hummer, NP      . mirtazapine (REMERON) tablet 7.5 mg  7.5 mg Oral QHS Saramma Eappen, MD      . nicotine (NICODERM CQ - dosed in mg/24 hours) patch 21 mg  21 mg Transdermal Daily Derrill Center, NP   21 mg at 08/09/15 1004  . ondansetron (ZOFRAN) tablet 4 mg  4 mg Oral Q8H PRN Niel Hummer, NP      . pantoprazole  (PROTONIX) EC tablet 40 mg  40 mg Oral Daily Niel Hummer, NP   40 mg at 08/09/15 0820  . potassium chloride (K-DUR,KLOR-CON) CR tablet 10 mEq  10 mEq Oral BID Niel Hummer, NP   10 mEq at 08/09/15 0820  . pravastatin (PRAVACHOL) tablet 20 mg  20 mg Oral q1800 Niel Hummer, NP      . sucralfate (CARAFATE) tablet 1 g  1 g Oral QID Niel Hummer, NP   1 g at 08/09/15 1152  . [START ON 08/15/2015] Vitamin D (Ergocalciferol) (DRISDOL) capsule 50,000 Units  50,000 Units  Oral Q7 days Niel Hummer, NP       PTA Medications: Prescriptions prior to admission  Medication Sig Dispense Refill Last Dose  . acetaminophen (TYLENOL) 500 MG tablet Take 500 mg by mouth every 6 (six) hours as needed for headache.   unknown  . famotidine (PEPCID) 20 MG tablet Take 20 mg by mouth 2 (two) times daily.   08/07/2015 at Unknown time  . lamoTRIgine (LAMICTAL) 25 MG tablet Take 25 mg by mouth daily.   08/07/2015 at Unknown time  . levothyroxine (SYNTHROID, LEVOTHROID) 75 MCG tablet Take 75 mcg by mouth every evening.    08/07/2015 at Unknown time  . lisinopril-hydrochlorothiazide (PRINZIDE,ZESTORETIC) 10-12.5 MG per tablet Take 1 tablet by mouth daily.    08/07/2015 at Unknown time  . lovastatin (MEVACOR) 20 MG tablet Take 20 mg by mouth at bedtime.   08/06/2015 at Unknown time  . Multiple Vitamins-Minerals (ADULT ONE DAILY GUMMIES PO) Take by mouth daily.   08/07/2015 at Unknown time  . nortriptyline (PAMELOR) 25 MG capsule Take 25 mg by mouth at bedtime.   08/06/2015 at Unknown time    Musculoskeletal: Strength & Muscle Tone: within normal limits Gait & Station: normal Patient leans: N/A  Psychiatric Specialty Exam: Physical Exam  Vitals reviewed. Constitutional: She is oriented to person, place, and time. She appears well-developed.  HENT:  Head: Normocephalic.  Musculoskeletal: Normal range of motion.  Neurological: She is alert and oriented to person, place, and time.  Skin: Skin is dry.  Psychiatric: She has  a normal mood and affect.    Review of Systems  Psychiatric/Behavioral: Positive for depression, suicidal ideas and substance abuse. The patient is nervous/anxious.   All other systems reviewed and are negative.   Blood pressure 125/75, pulse 92, temperature 98.3 F (36.8 C), temperature source Oral, resp. rate 16, height 5\' 2"  (1.575 m), weight 59.421 kg (131 lb).Body mass index is 23.95 kg/(m^2).  General Appearance: Guarded  Eye Contact::  Good  Speech:  Clear and Coherent  Volume:  Normal  Mood:  Depressed  Affect:  Congruent  Thought Process:  Coherent  Orientation:  Full (Time, Place, and Person)  Thought Content:  Hallucinations: None  Suicidal Thoughts:  No  Homicidal Thoughts:  No  Memory:  Immediate;   Fair Recent;   Fair Remote;   Fair  Judgement:  Fair  Insight:  Fair  Psychomotor Activity:  Restlessness  Concentration:  Fair  Recall:  [employer] of Knowledge:Fair  Language: Good  Akathisia:  No  Handed:  Right  AIMS (if indicated):     Assets:  Communication Skills Desire for Improvement Social Support  ADL's:  Intact  Cognition: WNL  Sleep:        I agree with current treatment plan on 08/09/2015, Patient seen face-to-face for psychiatric evaluation follow-up, chart reviewed and case discussed with the MD Dwyane Dee. Reviewed the information documented and agree with the treatment plan.  Treatment Plan Summary: Daily contact with patient to assess and evaluate symptoms and progress in treatment and Medication managementt  Start Deoakote 250 mg PO Q 8 for mood stabilization. Continue with Remeron 7.5 mg for insomnia Will continue to monitor vitals ,medication compliance and treatment side effects while patient is here.  Continue K-Due 10 mEq PO BID for 2 days and reassure Potassium 08/11/2015  Reviewed labs:Glucose 163, Potassium 3.1 (low) Creatinine 1.24 elevated ,BAL - 0, UDS - positive for THC CSW will start working on disposition.  Patient to  participate in therapeutic milieu   Observation Level/Precautions:  15 minute checks  Laboratory:  CBC Chemistry Profile UDS UA  Psychotherapy:  Individual and group session  Medications:  See Above  Consultations:  Psychiatry   Discharge Concerns:  Safety, stabilization, and risk of access to medication and medication stabilization   Estimated 0000000  Other:     I certify that inpatient services furnished can reasonably be expected to improve the patient's condition.    Derrill Center, NP 5/21/20174:25 PM

## 2015-08-09 NOTE — BHH Suicide Risk Assessment (Signed)
[facility] INPATIENT:  Family/Significant Other Suicide Prevention Education  Suicide Prevention Education:  Patient Refusal for Family/Significant Other Suicide Prevention Education: The patient [name] has refused to provide written consent for family/significant other to be provided Family/Significant Other Suicide Prevention Education during admission and/or prior to discharge.  Physician notified.  Writer provided suicide prevention education directly to patient; conversation included risk factors, warning signs and resources to contact for help. Mobile crisis services explained and  explanations given as to resources which will be included on patient's discharge paperwork.  Lyla Glassing 08/09/2015, 4:20 PM

## 2015-08-09 NOTE — Progress Notes (Signed)
Patient ID: [name], female   DOB: 05/13/1955, 60 y.o.   MRN: [id number]   Pt currently presents with a affect and anxious behavior. Per self inventory, pt rates depression at a 8, hopelessness 3 and anxiety 2. Pt's daily goal is "no goals." Pt reports good sleep, a poor appetite, low energy and good concentration. Pt reports that she is concerned about her husband at home, pt states "he is older than me and I can't take care of him right now." Stomach cramping and pain 7/10 from a peptic ulcer patient says started bothering her a couple of months ago. Pt asks Probation officer "do you think that the teeth that are digging into my teeth could have caused the ulcer?"   Pt provided with medications per providers orders. Pt's labs and vitals were monitored throughout the day. Pt supported emotionally and encouraged to express concerns and questions. Pt educated on medications and potential side effects. Pt also educated on peptic ulcer diagnosis.  Pt's safety ensured with 15 minute and environmental checks. Pt currently denies SI/HI and A/V hallucinations. Pt verbally agrees to seek staff if SI/HI or A/VH occurs and to consult with staff before acting on any harmful thoughts. Will continue POC.

## 2015-08-09 NOTE — BHH Counselor (Signed)
Patient attend group. Her day was 8. She meet her goal.

## 2015-08-09 NOTE — Progress Notes (Signed)
Patient ID: ANAHLIA BONCZEK, female   DOB: February 15, 1956, 60 y.o.   MRN: [id number] Adult Psychoeducational Group Note  Date:  08/09/2015 Time:  02:30pm   Group Topic/Focus:  Coping With Mental Health Crisis:   The purpose of this group is to help patients identify strategies for coping with mental health crisis.  Group discusses possible causes of crisis and ways to manage them effectively.  Participation Level:  Active  Participation Quality:  Attentive, Sharing and Supportive  Affect:  Appropriate  Cognitive:  Alert, Oriented and Confused  Insight: Improving  Engagement in Group:  Improving  Modes of Intervention:  Activity, Discussion, Education and Support  Additional Comments:  Pt able to identify a coping skill to utilize post discharge; fishing, doing art and listening to music.   Elenore Rota 08/09/2015, 3:34 PM

## 2015-08-09 NOTE — Progress Notes (Signed)
Writer has observed patient up in the dayroom with minimal interaction with peers. She reports having had a good day overall and complimented the staff for being so kind and helpful. She reports that she has been attending groups and is working on her self esteem while here. She is compliant with scheduled medications and has had complaints of abdominal pain in which she received medication for. She denies si/hi/a/v hallucinations. Safety maintained on unit with 15 min checks.
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
## 2015-08-09 NOTE — BHH Group Notes (Signed)
[facility] LCSW Group Therapy Note   08/09/2015  10:30 AM   Type of Therapy and Topic: Group Therapy: Feelings Around Returning Home & Establishing a Supportive Framework  Participation Level: Active   Description of Group:  Patients first processed thoughts and feelings about up coming discharge. These included fears of upcoming changes, lack of change, new living environments, judgements and expectations from others and overall stigma of MH issues. We then discussed what is a supportive framework? What does it look like feel like and how do I discern it from and unhealthy non-supportive network? Learn how to cope when supports are not helpful and don't support you. Discuss what to do when your family/friends are not supportive.   Summary of Patient Progress:  Pt engaged easily during group session and showed empathy for others. As patients processed their anxiety about discharge and described healthy supports patient shared she expects family relationships to be her greatest challenge up[on discharge in addition to making certain she stays on correct medication. Pt processed her feelings associated with medication changes made almost one year ago,    Sheilah Pigeon, LCSW

## 2015-08-09 NOTE — BHH Suicide Risk Assessment (Signed)
[facility] Admission Suicide Risk Assessment   Nursing information obtained from:    Demographic factors:    Current Mental Status:    Loss Factors:    Historical Factors:    Risk Reduction Factors:     Total Time spent with patient: 30 minutes Principal Problem: Bipolar disorder with severe depression ([place]) Diagnosis:   Patient Active Problem List   Diagnosis Date Noted  . Bipolar disorder with severe depression ([place]) [F31.4] 08/09/2015   Subjective Data: Its those medications that doctor started me on, lamictal and nortriptyline - they make me worse , i feel suicidal on them. I was doing so well on cymbalta and Depakote before , then Dr.Lay took me off of it, since he is not a cymbalta person. I was diagnosed with bipolar do , i have had manic sx in the past.  Continued Clinical Symptoms:  Alcohol Use Disorder Identification Test Final Score (AUDIT): 0 The "Alcohol Use Disorders Identification Test", Guidelines for Use in Primary Care, Second Edition.  World Pharmacologist [facility]). Score between 0-7:  no or low risk or alcohol related problems. Score between 8-15:  moderate risk of alcohol related problems. Score between 16-19:  high risk of alcohol related problems. Score 20 or above:  warrants further diagnostic evaluation for alcohol dependence and treatment.   CLINICAL FACTORS:   Bipolar Disorder:   Depressive phase Alcohol/Substance Abuse/Dependencies Previous Psychiatric Diagnoses and Treatments   Musculoskeletal: Strength & Muscle Tone: within normal limits Gait & Station: normal Patient leans: N/A  Psychiatric Specialty Exam: Review of Systems  Psychiatric/Behavioral: Positive for depression, suicidal ideas and substance abuse. The patient is nervous/anxious and has insomnia.   All other systems reviewed and are negative.   Blood pressure 125/75, pulse 92, temperature 98.3 F (36.8 C), temperature source Oral, resp. rate 16, height 5\' 2"  (1.575 m), weight 59.421 kg  (131 lb).Body mass index is 23.95 kg/(m^2).  General Appearance: Casual  Eye Contact::  Fair  Speech:  Clear and Coherent  Volume:  Normal  Mood:  Anxious and Depressed  Affect:  Appropriate  Thought Process:  Coherent  Orientation:  Full (Time, Place, and Person)  Thought Content:  Rumination  Suicidal Thoughts:  Yes.  without intent/plan  Homicidal Thoughts:  No  Memory:  Immediate;   Fair Recent;   Fair Remote;   Fair  Judgement:  Impaired  Insight:  Shallow  Psychomotor Activity:  Normal  Concentration:  Fair  Recall:  [place]  Language: Fair  Akathisia:  No  Handed:  Right  AIMS (if indicated):     Assets:  Desire for Improvement  Sleep:     Cognition: WNL  ADL's:  Intact    COGNITIVE FEATURES THAT CONTRIBUTE TO RISK:  Closed-mindedness, Polarized thinking and Thought constriction (tunnel vision)    SUICIDE RISK:   Moderate:  Frequent suicidal ideation with limited intensity, and duration, some specificity in terms of plans, no associated intent, good self-control, limited dysphoria/symptomatology, some risk factors present, and identifiable protective factors, including available and accessible social support.  PLAN OF CARE: Patient will benefit from inpatient treatment and stabilization.  Estimated length of stay is 5-7 days.  Reviewed past medical records,treatment plan.  Will restart Depakote - she used to do well on it in the past. Start Depakote DR 250 mg po tid for mood sx. Depakote level in 5 days. Will add Remeron 7.5 mg po qhs for sleep. Will discontinue Lamictal, pamelor for side effects. Case discussed with  Tanika NP. CSW will start working on disposition.  Patient to participate in therapeutic milieu .       I certify that inpatient services furnished can reasonably be expected to improve the patient's condition.   Omnia Dollinger, MD 08/09/2015, 1:27 PM

## 2015-08-10 MED ORDER — DULOXETINE HCL 30 MG PO CPEP
30.0000 mg | ORAL_CAPSULE | Freq: Every day | ORAL | Status: DC
  Administered 2015-08-11: 30 mg via ORAL
  Filled 2015-08-10: qty 7
  Filled 2015-08-10 (×3): qty 1

## 2015-08-10 MED ORDER — DIVALPROEX SODIUM ER 500 MG PO TB24
500.0000 mg | ORAL_TABLET | Freq: Every day | ORAL | Status: DC
  Administered 2015-08-10: 500 mg via ORAL
  Filled 2015-08-10 (×2): qty 1
  Filled 2015-08-10: qty 7
  Filled 2015-08-10: qty 1

## 2015-08-10 MED ORDER — ENSURE ENLIVE PO LIQD: 237.0000 mL | ORAL | Status: DC

## 2015-08-10 NOTE — Progress Notes (Signed)
Writer spoke with patient 1:1 and she was pleased about her medications changed but was concerned about the side effects of her stopping her pamelor completely and possible side effects. Writer encouraged her to speak with her doctor about this, she reports that she reads up on the side effects of medications she is on. She is hopeful to discharge soon. Support given and safety maintained on unit with 15 min checks.

## 2015-08-10 NOTE — Progress Notes (Signed)
NUTRITION ASSESSMENT  Pt identified as at risk on the Malnutrition Screen Tool  INTERVENTION: 1. Educated patient on the importance of nutrition and encouraged intake of food and beverages. 2. Discussed weight goals. 3. Supplements: continue Ensure Enlive but will decrease to once/day, this supplement provides 350 kcal and 20 grams of protein   NUTRITION DIAGNOSIS: Unintentional weight loss related to sub-optimal intake as evidenced by pt report.   Goal: Pt to meet >/= 90% of their estimated nutrition needs.  Monitor:  PO intake  Assessment:  Pt screened for MST. Pt admitted with bipolar disorder with severe depression. Pt reports decreasing appetite and intakes PTA due to increasing depression and feelings of anxiety; she states that PTA she was having daily panic attacks. Per review, current weight consistent with weight from September 2016 but unsure of other fluctuations in weight between this time.   Pt currently ordered Ensure Enlive BID; will decrease to once/day to encourage PO intakes of meals and snacks.   60 y.o. female  Height: Ht Readings from Last 1 Encounters:  08/08/15 5\' 2"  (1.575 m)    Weight: Wt Readings from Last 1 Encounters:  08/08/15 131 lb (59.421 kg)    Weight Hx: Wt Readings from Last 10 Encounters:  08/08/15 131 lb (59.421 kg)  08/07/15 130 lb (58.968 kg)  11/28/14 134 lb (60.782 kg)  05/23/14 128 lb (58.06 kg)  05/20/14 128 lb (58.06 kg)  01/07/14 125 lb (56.7 kg)  01/21/13 119 lb 6 oz (54.148 kg)  11/27/12 118 lb (53.524 kg)  11/12/12 120 lb (54.432 kg)    BMI:  Body mass index is 23.95 kg/(m^2). Pt meets criteria for normal weight based on current BMI.  Estimated Nutritional Needs: Kcal: 25-30 kcal/kg Protein: > 1 gram protein/kg Fluid: 1 ml/kcal  Diet Order: Diet regular Room service appropriate?: Yes; Fluid consistency:: Thin Pt is also offered choice of unit snacks mid-morning and mid-afternoon.  Pt is eating as desired.    Lab results and medications reviewed.      Jarome Matin, RD, LDN Inpatient Clinical Dietitian Pager # [phone] After hours/weekend pager # [phone]

## 2015-08-10 NOTE — Progress Notes (Signed)
Recreation Therapy Notes  Date: 05.22.2017 Time: 9:30am Location: [address] Group Room   Group Topic: Stress Management  Goal Area(s) Addresses:  Patient will actively participate in stress management techniques presented during session.   Behavioral Response: Engaged, Attentive   Intervention: Stress management techniques  Activity :  Deep Breathing and Guided Imagery. LRT provided education, instruction and demonstration on practice of Deep Breathing and Guided Imagery. Patient was asked to participate in technique introduced during session.   Education:  Stress Management, Discharge Planning.   Education Outcome: Acknowledges education  Clinical Observations/Feedback: Patient actively engaged in technique introduced, expressed no concerns and demonstrated ability to practice independently post d/c.   Laureen Ochs Vearl Aitken, LRT/CTRS        Lane Hacker 08/10/2015 12:06 PM

## 2015-08-10 NOTE — Progress Notes (Signed)
Patient ID: [name], female   DOB: Dec 07, 1955, 60 y.o.   MRN: [id number]  Pt currently presents with a flat affect and anxious behavior. Per self inventory, pt rates depression at a 3, hopelessness 0 and anxiety 4. Pt's daily goal is to "talking to others" and they intend to do so by "stay out of my room." Pt reports fair sleep, a fair appetite, low energy and good concentration. Pt reports pain in her abdomen 5/10.   Pt provided with medications per providers orders. Pt's labs and vitals were monitored throughout the day. Pt supported emotionally and encouraged to express concerns and questions. Pt educated on medications, assertiveness techniques and womens safety resources. Needs reinforcement.  Pt's safety ensured with 15 minute and environmental checks. Pt currently denies SI/HI and A/V hallucinations. Pt verbally agrees to seek staff if SI/HI or A/VH occurs and to consult with staff before acting on any harmful thoughts. Per MD pt will be discharged tomorrow. Pt expresses that she will use tai chi as a coping skill post discharge and will try to get back in school. Pt reports "I am not divorcing my husband because if he died I would feel too guilty."  Will continue POC.

## 2015-08-10 NOTE — BHH Group Notes (Signed)
Adult Psychoeducational Group Note  Date:  08/10/2015 Time:  10:47 PM  Group Topic/Focus:  Wrap-Up Group:   The focus of this group is to help patients review their daily goal of treatment and discuss progress on daily workbooks.  Participation Level:  Active  Participation Quality:  Appropriate  Affect:  Appropriate  Cognitive:  Appropriate  Insight: Appropriate  Engagement in Group:  Engaged  Modes of Intervention:  Discussion  Additional Comments:  Pt rated her day a 53.  Her goal was to get close to her peers.  Pt is discharging tomorrow.  [name] A 08/10/2015, 10:47 PM

## 2015-08-10 NOTE — BHH Group Notes (Signed)
[place] LCSW Group Therapy 08/10/2015  1:15 pm  Type of Therapy: Group Therapy Participation Level: Active  Participation Quality: Attentive, Supportive  Affect: Appropriate  Cognitive: Alert and Oriented  Insight: Developing/Improving and Engaged  Engagement in Therapy: Developing/Improving and Engaged  Modes of Intervention: Clarification, Confrontation, Discussion, Education, Exploration,  Limit-setting, Orientation, Problem-solving, Rapport Building, Art therapist, Socialization and Support  Summary of Progress/Problems: Pt identified obstacles faced currently and processed barriers involved in overcoming these obstacles. Pt identified steps necessary for overcoming these obstacles and explored motivation (internal and external) for facing these difficulties head on. Pt further identified one area of concern in their lives and chose a goal to focus on for today. Patient discussed strained relationships with her family (particularly her mother) and the importance of support in her wellbeing. She discussed not having closure over her father's death and related to other group members' experiences with grief. CSW and other group members provided patient with emotional support and encouragement.    [name], LCSW Clinical Social Worker [facility] [phone]

## 2015-08-10 NOTE — Progress Notes (Addendum)
MD Progress Note  [id number] PM [name]  MRN:  [id number] Subjective:  Patient reports she is feeling partially better today. She states she has history of Bipolar Disorder, and had recently been feeling " depressed, really bad", which she feels was at least partially due to medication changes. States she feels recent Amitryptiline trial made her feel worse. Reports prior treatment with combination of Cymbalta and Depakote ER which was very effective , well tolerated . Objective : I have discussed case with treatment team . Patient reports history of Bipolar Spectrum Disorder, currently depressed . Today feeling better than prior to admission- currently less depressed, states mood " OK" today, and denies any current suicidal ideations . No psychotic symptoms. Visible on unit, going to groups, no disruptive or agitated behaviors on unit. Patient reports she has a history of Peptic Ulcer Disease/GERD , and had been experiencing significant epigastric discomfort , reflux . Currently on Protonix, Carafate, and GI cocktail x 1 . Feeling better and at this time states symptoms have resolved. Denies vomiting, denies melenas .  Principal Problem: Bipolar disorder with severe depression ([place]) Diagnosis:   Patient Active Problem List   Diagnosis Date Noted  . Bipolar disorder with severe depression ([place]) [F31.4] 08/09/2015   Total Time spent with patient: 20 minutes     Past Medical History:  Past Medical History  Diagnosis Date  . Hypertension   . Seizures ([place])   . Depression   . Thyroid disease   . Coronary artery disease     Past Surgical History  Procedure Laterality Date  . Tubal ligation    . Appendectomy     Family History:  Family History  Problem Relation Age of Onset  . Diabetes Mother   . Depression Maternal Uncle     Social History:  History  Alcohol Use No     History  Drug Use No    Social History   Social History  . Marital Status: Married   Spouse Name: N/A  . Number of Children: N/A  . Years of Education: N/A   Social History Main Topics  . Smoking status: Current Every Day Smoker -- 1.00 packs/day    Types: Cigarettes  . Smokeless tobacco: None  . Alcohol Use: No  . Drug Use: No  . Sexual Activity: Yes    Birth Control/ Protection: Surgical   Other Topics Concern  . None   Social History Narrative   Additional Social History:   Sleep: Good  Appetite:  Good  Current Medications: Current Facility-Administered Medications  Medication Dose Route Frequency Provider Last Rate Last Dose  . acetaminophen (TYLENOL) tablet 650 mg  650 mg Oral Q6H PRN Niel Hummer, NP   650 mg at 08/08/15 1936  . alum & mag hydroxide-simeth (MAALOX/MYLANTA) 200-200-20 MG/5ML suspension 30 mL  30 mL Oral Q4H PRN Niel Hummer, NP   30 mL at 08/09/15 1827  . divalproex (DEPAKOTE ER) 24 hr tablet 500 mg  500 mg Oral QHS Jenne Campus, MD      . Derrill Memo ON 08/11/2015] DULoxetine (CYMBALTA) DR capsule 30 mg  30 mg Oral Daily Hadley Soileau A Sheilia Reznick, MD      . feeding supplement (ENSURE ENLIVE) (ENSURE ENLIVE) liquid 237 mL  237 mL Oral Q24H Bryana Froemming A Shun Pletz, MD      . lisinopril (PRINIVIL,ZESTRIL) tablet 10 mg  10 mg Oral Daily Niel Hummer, NP   10 mg at 08/10/15 0816   And  .  hydrochlorothiazide (MICROZIDE) capsule 12.5 mg  12.5 mg Oral Daily Niel Hummer, NP   12.5 mg at 08/10/15 0816  . levothyroxine (SYNTHROID, LEVOTHROID) tablet 75 mcg  75 mcg Oral QPM Niel Hummer, NP   75 mcg at 08/09/15 1826  . LORazepam (ATIVAN) tablet 1 mg  1 mg Oral Q8H PRN Niel Hummer, NP      . magnesium hydroxide (MILK OF MAGNESIA) suspension 30 mL  30 mL Oral Daily PRN Niel Hummer, NP      . nicotine (NICODERM CQ - dosed in mg/24 hours) patch 21 mg  21 mg Transdermal Daily Derrill Center, NP   21 mg at 08/10/15 0819  . ondansetron (ZOFRAN) tablet 4 mg  4 mg Oral Q8H PRN Niel Hummer, NP      . pantoprazole (PROTONIX) EC tablet 40 mg  40 mg Oral Daily Niel Hummer, NP   40 mg at 08/10/15 0817  . potassium chloride (K-DUR,KLOR-CON) CR tablet 10 mEq  10 mEq Oral BID Niel Hummer, NP   10 mEq at 08/10/15 0816  . pravastatin (PRAVACHOL) tablet 20 mg  20 mg Oral q1800 Niel Hummer, NP   20 mg at 08/09/15 1826  . sucralfate (CARAFATE) tablet 1 g  1 g Oral QID Niel Hummer, NP   1 g at 08/10/15 1147  . [START ON 08/15/2015] Vitamin D (Ergocalciferol) (DRISDOL) capsule 50,000 Units  50,000 Units Oral Q7 days Niel Hummer, NP        Lab Results: No results found for this or any previous visit (from the past 48 hour(s)).  Blood Alcohol level:  Lab Results  Component Value Date   [facility] <5 08/07/2015   ETH <11 11/27/2012    Physical Findings: AIMS: Facial and Oral Movements Muscles of Facial Expression: None, normal Lips and Perioral Area: None, normal Jaw: None, normal Tongue: None, normal,Extremity Movements Upper (arms, wrists, hands, fingers): None, normal Lower (legs, knees, ankles, toes): None, normal, Trunk Movements Neck, shoulders, hips: None, normal, Overall Severity Severity of abnormal movements (highest score from questions above): None, normal Incapacitation due to abnormal movements: None, normal Patient's awareness of abnormal movements (rate only patient's report): No Awareness, Dental Status Current problems with teeth and/or dentures?: No Does patient usually wear dentures?: No  CIWA:  CIWA-Ar Total: 3 COWS:  COWS Total Score: 2  Musculoskeletal: Strength & Muscle Tone: within normal limits Gait & Station: normal Patient leans: N/A  Psychiatric Specialty Exam: Physical Exam  ROS no chest pain, no shortness of breath, no epigastric discomfort today, no vomiting, no melenas   Blood pressure 119/70, pulse 94, temperature 99 F (37.2 C), temperature source Oral, resp. rate 18, height 5\' 2"  (1.575 m), weight 131 lb (59.421 kg).Body mass index is 23.95 kg/(m^2).  General Appearance: Well Groomed  Eye Contact:  Good  Speech:   Normal Rate  Volume:  Normal  Mood:  improving , less depressed   Affect:  Appropriate  Thought Process:  Linear  Orientation:  Full (Time, Place, and Person)  Thought Content:  denies hallucinations, no delusions , not internally preoccupied   Suicidal Thoughts:  No- today denies suicidal ideations / denies self injurious ideations   Homicidal Thoughts:  No denies homicidal ideations  Memory:  recent and remote grossly intact   Judgement:  Improving   Insight:  improving   Psychomotor Activity:  Normal  Concentration:  Concentration: Good  Recall:  Good  Fund of Knowledge:  Good  Language:  Good  Akathisia:  Negative  Handed:  Right  AIMS (if indicated):     Assets:  Desire for Improvement Resilience  ADL's:  Intact  Cognition:  WNL  Sleep:  Number of Hours: 6.75   Assessment - patient reports history of Bipolar Spectrum Disorder, and history of good response to Depakote/Cymbalta trial. At this time feeling better than on admission, and today denies any suicidal ideations . Epigastric discomfort associated with GERD/ PUD  also improved .   Treatment Plan  Summary: Daily contact with patient to assess and evaluate symptoms and progress in treatment, Medication management, Plan inpatient treatment  and medications as below  Encourage ongoing group, milieu participation  Change Depakote to Depakote ER 500 mgrs QHS , per patient's request  ( for mood disorder ) Restart Cymbalta 30 mgrs QDAY initially, for depression, mood disorder D/C Remeron Continue K DUR (K+) supplementation Check Valproic Acid Serum level in AM Treatment team working on disposition planning    Neita Garnet, MD 08/10/2015, 2:26 PM

## 2015-08-11 MED ORDER — DIVALPROEX SODIUM ER 500 MG PO TB24: 500.0000 mg | ORAL_TABLET | Freq: Every day | ORAL | Status: DC

## 2015-08-11 MED ORDER — NICOTINE 21 MG/24HR TD PT24: 21.0000 mg | MEDICATED_PATCH | Freq: Every day | TRANSDERMAL | Status: DC

## 2015-08-11 MED ORDER — PRAVASTATIN SODIUM 20 MG PO TABS: 20.0000 mg | ORAL_TABLET | Freq: Every day | ORAL | Status: DC

## 2015-08-11 MED ORDER — LEVOTHYROXINE SODIUM 75 MCG PO TABS: 75.0000 ug | ORAL_TABLET | Freq: Every evening | ORAL | Status: DC

## 2015-08-11 MED ORDER — LISINOPRIL-HYDROCHLOROTHIAZIDE 10-12.5 MG PO TABS: 1.0000 | ORAL_TABLET | Freq: Every day | ORAL | Status: DC

## 2015-08-11 MED ORDER — DULOXETINE HCL 30 MG PO CPEP: 30.0000 mg | ORAL_CAPSULE | Freq: Every day | ORAL | Status: DC

## 2015-08-11 NOTE — Progress Notes (Signed)
Recreation Therapy Notes  Animal-Assisted Activity (AAA) Program Checklist/Progress Notes Patient Eligibility Criteria Checklist & Daily Group note for Rec Tx Intervention  Date: 05.23.2017 Time: 2:45pm Location: [address]    AAA/T Program Assumption of Risk Form signed by Patient/ or Parent Legal Guardian Yes  Patient is free of allergies or sever asthma Yes  Patient reports no fear of animals Yes  Patient reports no history of cruelty to animals Yes  Patient understands his/her participation is voluntary Yes  Patient washes hands before animal contact Yes  Patient washes hands after animal contact Yes  Behavioral Response: Engaged, Appropriate   Education: Contractor, Appropriate Animal Interaction   Education Outcome: Acknowledges education.   Clinical Observations/Feedback: Patient interacted appropriately with therapy dog and peers during session.    Laureen Ochs Cristal Howatt, LRT/CTRS        Lane Hacker 08/11/2015 3:19 PM

## 2015-08-11 NOTE — Discharge Summary (Signed)
Physician Discharge Summary Note  Patient:  [name] is an 60 y.o., female MRN:  [id number] DOB:  1956-02-15 Patient phone:  [phone] (home)  Patient address:   [place] [address],  Total Time spent with patient: 45 minutes  Date of Admission:  08/08/2015 Date of Discharge: 08/11/2015  Reason for Admission:    Principal Problem: Bipolar disorder with severe depression [facility]) Discharge Diagnoses: Patient Active Problem List   Diagnosis Date Noted  . Bipolar disorder with severe depression ([place]) [F31.4] 08/09/2015    Past Psychiatric History:  See HPI  Past Medical History:  Past Medical History  Diagnosis Date  . Hypertension   . Seizures ([place])   . Depression   . Thyroid disease   . Coronary artery disease     Past Surgical History  Procedure Laterality Date  . Tubal ligation    . Appendectomy     Family History:  Family History  Problem Relation Age of Onset  . Diabetes Mother   . Depression Maternal Uncle    Family Psychiatric  History: see HPI Social History:  History  Alcohol Use No     History  Drug Use No    Social History   Social History  . Marital Status: Married    Spouse Name: N/A  . Number of Children: N/A  . Years of Education: N/A   Social History Main Topics  . Smoking status: Current Every Day Smoker -- 1.00 packs/day    Types: Cigarettes  . Smokeless tobacco: None  . Alcohol Use: No  . Drug Use: No  . Sexual Activity: Yes    Birth Control/ Protection: Surgical   Other Topics Concern  . None   Social History Narrative    Hospital Course:  [name], 60 y.o. female voluntarily presented to [facility] with c/o SI with a plan to OD on her blood pressure medications.  Patient reported marital problems but unclear on further emotional triggers of her admission.  Patient was reported to be overly concerned that she was not on Cymbalta and that this was the only meds that was effective for her and her  depression.    Collateral information from the chart reveals that patient has several factors that increase risk for suicide such as reported SI with a plan, hx of multiple suicide attempts and no family supports.   [name] was admitted for Bipolar disorder with severe depression ([place]) and crisis management.  She was treated with Depakote ER 500 mg QHS, per patient's request (for mood disorder ), Cymbalta 30 mg daily for depression, mood disorder.  Medical problems were identified and treated as needed.  Home medications were restarted as appropriate.  Improvement was monitored by observation and [name] daily report of symptom reduction.  Emotional and mental status was monitored by daily self inventory reports completed by [name] and clinical staff.  Patient reported continued improvement, denied any new concerns.  Patient had been compliant on medications and denied side effects.  Support and encouragement was provided.    Patient did well during inpatient stay.  At time of discharge, patient rated both depression and anxiety levels to be manageable and minimal.  Patient was able to identify the triggers of emotional crises and de-stabilizations.  Patient identified the positive things in life that would help in dealing with feelings of loss, depression and unhealthy or abusive tendencies.         [name]  was evaluated by the treatment team for stability and plans for continued recovery upon discharge.  She was offered further treatment options upon discharge including Residential, Intensive Outpatient and Outpatient treatment.  She will follow up with agencies listed below for medication management and counseling.  Encouraged patient to maintain satisfactory support network and home environment.  Advised to adhere to medication compliance and outpatient treatment follow up.      [name] motivation was an integral factor for scheduling further treatment.  Employment,  transportation, bed availability, health status, family support, and any pending legal issues were also considered during her hospital stay.  Upon completion of this admission the patient was both mentally and medically stable for discharge denying suicidal/homicidal ideation, auditory/visual/tactile hallucinations, delusional thoughts and paranoia.      Physical Findings: AIMS: Facial and Oral Movements Muscles of Facial Expression: None, normal Lips and Perioral Area: None, normal Jaw: None, normal Tongue: None, normal,Extremity Movements Upper (arms, wrists, hands, fingers): None, normal Lower (legs, knees, ankles, toes): None, normal, Trunk Movements Neck, shoulders, hips: None, normal, Overall Severity Severity of abnormal movements (highest score from questions above): None, normal Incapacitation due to abnormal movements: None, normal Patient's awareness of abnormal movements (rate only patient's report): No Awareness, Dental Status Current problems with teeth and/or dentures?: No Does patient usually wear dentures?: No  CIWA:  CIWA-Ar Total: 3 COWS:  COWS Total Score: 2  Musculoskeletal: Strength & Muscle Tone: within normal limits Gait & Station: normal Patient leans: N/A  Psychiatric Specialty Exam:  See MD SRA Physical Exam  Vitals reviewed. Psychiatric: Her mood appears anxious. Depressed: improving.    Review of Systems  Psychiatric/Behavioral: Negative for suicidal ideas, hallucinations and substance abuse. The patient does not have insomnia.   All other systems reviewed and are negative.   Blood pressure 141/63, pulse 94, temperature 98 F (36.7 C), temperature source Oral, resp. rate 16, height 5\' 2"  (1.575 m), weight 59.421 kg (131 lb).Body mass index is 23.95 kg/(m^2).   Have you used any form of tobacco in the last 30 days? (Cigarettes, Smokeless Tobacco, Cigars, and/or Pipes): Yes  Has this patient used any form of tobacco in the last 30 days? (Cigarettes,  Smokeless Tobacco, Cigars, and/or Pipes) Yes, Rx given  Blood Alcohol level:  Lab Results  Component Value Date   ETH <5 08/07/2015   ETH <11 [id number]    Metabolic Disorder Labs:  No results found for: HGBA1C, MPG No results found for: PROLACTIN No results found for: CHOL, TRIG, HDL, CHOLHDL, VLDL, LDLCALC  See Psychiatric Specialty Exam and Suicide Risk Assessment completed by Attending Physician prior to discharge.  Discharge destination:  Home  Is patient on multiple antipsychotic therapies at discharge:  No   Has Patient had three or more failed trials of antipsychotic monotherapy by history:  No  Recommended Plan for Multiple Antipsychotic Therapies: NA     Medication List    STOP taking these medications        acetaminophen 500 MG tablet  Commonly known as:  TYLENOL     ADULT ONE DAILY GUMMIES PO     famotidine 20 MG tablet  Commonly known as:  PEPCID     lamoTRIgine 25 MG tablet  Commonly known as:  LAMICTAL     lovastatin 20 MG tablet  Commonly known as:  MEVACOR  Replaced by:  pravastatin 20 MG tablet     nortriptyline 25 MG capsule  Commonly known as:  PAMELOR  TAKE these medications      Indication   divalproex 500 MG 24 hr tablet  Commonly known as:  DEPAKOTE ER  Take 1 tablet (500 mg total) by mouth at bedtime.   Indication:  mood stabilization     DULoxetine 30 MG capsule  Commonly known as:  CYMBALTA  Take 1 capsule (30 mg total) by mouth daily.   Indication:  Major Depressive Disorder     levothyroxine 75 MCG tablet  Commonly known as:  SYNTHROID, LEVOTHROID  Take 1 tablet (75 mcg total) by mouth every evening.   Indication:  Underactive Thyroid     lisinopril-hydrochlorothiazide 10-12.5 MG tablet  Commonly known as:  PRINZIDE,ZESTORETIC  Take 1 tablet by mouth daily. Resume as in outpatient   Indication:  High Blood Pressure     nicotine 21 mg/24hr patch  Commonly known as:  NICODERM CQ - dosed in mg/24 hours  Place 1  patch (21 mg total) onto the skin daily.   Indication:  Nicotine Addiction     pravastatin 20 MG tablet  Commonly known as:  PRAVACHOL  Take 1 tablet (20 mg total) by mouth daily at 6 PM.   Indication:  High Amount of Triglycerides in the Blood           Follow-up Information    Follow up with [facility] On 08/13/2015.   Why:  Please walk-in between 8am-12pm for your hospital discharge appointment. Please bring your photo ID and hospital discharge paperwork.   Contact information:   [address] [place], [place] [address] Mailing Address: [address], [place], [place] [address] Phone: [phone] Fax: [phone]      Follow up with [facility] On 08/18/2015.   Why:  at 1:30pm for therapy. Please arrive between 1:00-1:15 in order to complete paperwork. Please bring your photo ID.   Contact information:   [address] [place], [place]  [address] Ph:  [phone] (fax) [phone]      Follow-up recommendations:  Activity:  as tol Diet:  as tol  Comments:  1.  Take all your medications as prescribed.   2.  Report any adverse side effects to outpatient provider. 3.  Patient instructed to not use alcohol or illegal drugs while on prescription medicines. 4.  In the event of worsening symptoms, instructed patient to call 911, the crisis hotline or go to nearest emergency room for evaluation of symptoms.  Signed: Janett Labella, NP [facility] 08/11/2015, 1:01 PM   Patient seen, Suicide Assessment Completed.  Disposition Plan Reviewed

## 2015-08-11 NOTE — Progress Notes (Signed)
Patient ID: [name], female   DOB: 1955/12/27, 60 y.o.   MRN: [id number] D: Patient pleasant on approach. Denies SI/HI/AVH and pain.No behavioral issues noted.  A: Support and encouragement offered as needed. Medications administered as prescribed.  R: Patient cooperative and appropriate on unit.

## 2015-08-11 NOTE — Progress Notes (Signed)
Patient ID: [name], female   DOB: 1955-06-05, 61 y.o.   MRN: [id number]  Pt discharged to lobby. Pt was stable and appreciative at that time. All papers and prescriptions were given and valuables returned. Verbal understanding expressed. Denies SI/HI and A/VH. Pt given opportunity to express concerns and ask questions.

## 2015-08-11 NOTE — Progress Notes (Signed)
Adult Psychoeducational Group Note  Date:  08/11/2015 Time: 08:55 am  Group Topic/Focus:  Overcoming Stress:   The focus of this group is to define stress and help patients assess their triggers.  Participation Level:  Active  Participation Quality:  Appropriate  Affect:  Appropriate  Cognitive:  Appropriate  Insight: Improving  Engagement in Group:  Engaged  Modes of Intervention:  Activity, Education and Support  Additional Comments:  Pt denies any depression or anxiety. Reports she was admitted for depression and changing her medications, reports she is discharging today. Participates in deep breathing and guided imagery.   Elenore Rota 08/11/2015, 9:21 AM

## 2015-08-11 NOTE — Tx Team (Signed)
Interdisciplinary Treatment Plan Update (Adult) Date: 08/11/2015   Date: 08/11/2015 9:08 AM  Progress in Treatment:  Attending groups: Yes  Participating in groups: Yes  Taking medication as prescribed: Yes  Tolerating medication: Yes  Family/Significant othe contact made: No, Pt declines Patient understands diagnosis: Yes AEB seeking help with depression Discussing patient identified problems/goals with staff: Yes  Medical problems stabilized or resolved: Yes  Denies suicidal/homicidal ideation: Yes Patient has not harmed self or Others: Yes   New problem(s) identified: None identified at this time.   Discharge Plan or Barriers: Pt will return home and follow-up with [facility]; requesting referral to [facility] for therapy  Additional comments:  Patient and CSW reviewed pt's identified goals and treatment plan. Patient verbalized understanding and agreed to treatment plan. CSW reviewed [facility] "Discharge Process and Patient Involvement" Form. Pt verbalized understanding of information provided and signed form.   Reason for Continuation of Hospitalization:  Anxiety Depression Medication stabilization Suicidal ideation  Estimated length of stay: 0 days  Review of initial/current patient goals per problem list:   1.  Goal(s): Patient will participate in aftercare plan  Met:  Yes  Target date: 3-5 days from date of admission   As evidenced by: Patient will participate within aftercare plan AEB aftercare provider and housing plan at discharge being identified.   08/11/15: Pt will return home and follow-up with outpatient providers  2.  Goal (s): Patient will exhibit decreased depressive symptoms and suicidal ideations.  Met:  Yes  Target date: 3-5 days from date of admission   As evidenced by: Patient will utilize self rating of depression at 3 or below and demonstrate decreased signs of depression or be deemed stable for discharge by MD.  08/11/15: Pt rates depression at  3/10; denies SI  3.  Goal(s): Patient will demonstrate decreased signs and symptoms of anxiety.  Met:  Adequate for DC  Target date: 3-5 days from date of admission   As evidenced by: Patient will utilize self rating of anxiety at 3 or below and demonstrated decreased signs of anxiety, or be deemed stable for discharge by MD  08/11/15: Pt rates anxiety at 4/10; MD feels that Pt's symptoms have decreased to the point that they can be managed in an outpatient setting.  Attendees:  Patient:    Family:    Physician: Dr. Parke Poisson, MD  08/11/2015 9:08 AM  Nursing: Lars Pinks, RN Case manager  08/11/2015 9:08 AM  Clinical Social Worker Peri Maris, [place] 08/11/2015 9:08 AM  Other: [name], [place] 08/11/2015 9:08 AM  Clinical:  RN 08/11/2015 9:08 AM  Other: , RN Charge Nurse 08/11/2015 9:08 AM  Other: Hilda Lias, [place], [place] Social Work [phone]

## 2015-08-11 NOTE — Progress Notes (Signed)
Patient ID: [name], female   DOB: 24-Jan-1956, 60 y.o.   MRN: [id number]   Pt currently presents with a flat affect and behavior. Per self inventory, pt rates depression, hopelessness and anxiety at a 0. Pt's daily goal is to "my goals" and they intend to do so by "just do it." Pt reports good sleep, a fair appetite, normal energy and good concentration. Pt reports looking forward to going home, states that she wants to get back into classes to "keep busy."   Pt provided with medications per providers orders. Pt's labs and vitals were monitored throughout the day. Pt supported emotionally and encouraged to express concerns and questions. Pt educated on medications and suicide prevention resources.   Pt's safety ensured with 15 minute and environmental checks. Pt currently denies SI/HI and A/V hallucinations. Pt verbally agrees to seek staff if SI/HI or A/VH occurs and to consult with staff before acting on any harmful thoughts. Will continue POC.

## 2015-08-11 NOTE — Progress Notes (Signed)
  [facility] Adult Case Management Discharge Plan :  Will you be returning to the same living situation after discharge:  Yes,  Pt returning home At discharge, do you have transportation home?: Yes,  Pt husband to pick up Do you have the ability to pay for your medications: Yes,  Pt provided with samples and prescriptions  Release of information consent forms completed and in the chart;  Patient's signature needed at discharge.  Patient to Follow up at: Follow-up Information    Follow up with [facility] On 08/13/2015.   Why:  Please walk-in between 8am-12pm for your hospital discharge appointment. Please bring your photo ID and hospital discharge paperwork.   Contact information:   [place], [place] [address] Mailing Address: [address], [place], [place] [address] Phone: [phone] Fax: [phone]      Follow up with [facility] On 08/18/2015.   Why:  at 1:30pm for therapy. Please arrive between 1:00-1:15 in order to complete paperwork. Please bring your photo ID.   Contact information:   [address], [place], [place]  [address] Ph:  [phone] (fax) [phone]      Next level of care provider has access to [place] and Suicide Prevention discussed: Yes,  with husband; see SPE note  Have you used any form of tobacco in the last 30 days? (Cigarettes, Smokeless Tobacco, Cigars, and/or Pipes): Yes  Has patient been referred to the Quitline?: Patient refused referral  Patient has been referred for addiction treatment: N/A  Bo Mcclintock 08/11/2015, 11:02 AM

## 2015-08-11 NOTE — BHH Suicide Risk Assessment (Signed)
[facility] Discharge Suicide Risk Assessment   Principal Problem: Bipolar disorder with severe depression [facility]) Discharge Diagnoses:  Patient Active Problem List   Diagnosis Date Noted  . Bipolar disorder with severe depression ([place]) [F31.4] 08/09/2015    Total Time spent with patient: 30 minutes  Musculoskeletal: Strength & Muscle Tone: within normal limits Gait & Station: normal Patient leans: N/A  Psychiatric Specialty Exam: ROS denies headache, no chest pain, no shortness of breath, no vomiting   Blood pressure 141/63, pulse 94, temperature 98 F (36.7 C), temperature source Oral, resp. rate 16, height 5\' 2"  (1.575 m), weight 131 lb (59.421 kg).Body mass index is 23.95 kg/(m^2).  General Appearance: Well Groomed  Eye Contact::  Good  Speech:  Normal Rate409  Volume:  Normal  Mood:  improved mood , denies depression at this time, no symptoms of mania   Affect:  Appropriate  Thought Process:  Linear  Orientation:  Full (Time, Place, and Person)  Thought Content:  denies hallucinations, no delusions, not internally preoccupied   Suicidal Thoughts:  No denies any suicidal ideations, denies any self injurious ideations , denies any homicidal ideations   Homicidal Thoughts:  No  Memory:  recent and remote grossly intact  Judgement:  Other:  improved   Insight:  improved   Psychomotor Activity:  Normal  Concentration:  Good  Recall:  Good  Fund of Knowledge:Good  Language: Good  Akathisia:  Negative  Handed:  Right  AIMS (if indicated):     Assets:  Desire for Improvement Resilience  Sleep:  Number of Hours: 6.75  Cognition: WNL  ADL's:  Intact   Mental Status Per Nursing Assessment::   On Admission:     Demographic Factors:  60 year old female , married   Loss Factors: States she feels recent medication changes contributed to worsening depression  Historical Factors: Prior psychiatric admissions, history of Bipolar Disorder  Risk Reduction Factors:   Sense of  responsibility to family, Living with another person, especially a relative, Positive social support and Positive coping skills or problem solving skills  Continued Clinical Symptoms:  At this time patient reports feeling better, presents alert, attentive well related, mood improved , denies significant depression at this time, affect appropriate, no thought disorder, no SI or HI, no psychotic symptoms, future oriented.  Denies medication side effects at this time- reports history of good response to Depakote ER and Cymbalta   Cognitive Features That Contribute To Risk:  No gross cognitive deficits noted upon discharge. Is alert , attentive, and oriented x 3   Suicide Risk:  Mild:  Suicidal ideation of limited frequency, intensity, duration, and specificity.  There are no identifiable plans, no associated intent, mild dysphoria and related symptoms, good self-control (both objective and subjective assessment), few other risk factors, and identifiable protective factors, including available and accessible social support.  Follow-up Information    Follow up with [facility] On 08/13/2015.   Why:  Please walk-in between 8am-12pm for your hospital discharge appointment. Please bring your photo ID and hospital discharge paperwork.   Contact information:   [place], [place] [address] Mailing Address: [address], [place], [place] [address] Phone: [phone] Fax: [phone]      Follow up with [facility] On 08/18/2015.   Why:  at 1:30pm for therapy. Please arrive between 1:00-1:15 in order to complete paperwork. Please bring your photo ID.   Contact information:   [address], [place], [place]  [address] Ph:  [phone] (fax) [phone]      Plan Of Care/Follow-up recommendations:  Activity:  as tolerated  Diet:  Heart Healthy Tests:  NA Other:  See below  Patient is leaving unit in good spirits  Plans to return home Follow up as above  Neita Garnet, MD 08/11/2015, 4:13 PM

## 2016-08-18 IMAGING — DX DG CHEST 2V
2 series · 2 of 2 positions shown · non-contrast
Comparison: Radiograph dated 11/12/2006

CLINICAL DATA: 59-year-old female with chest pain

EXAM:
CHEST  2 VIEW

[chest pa]
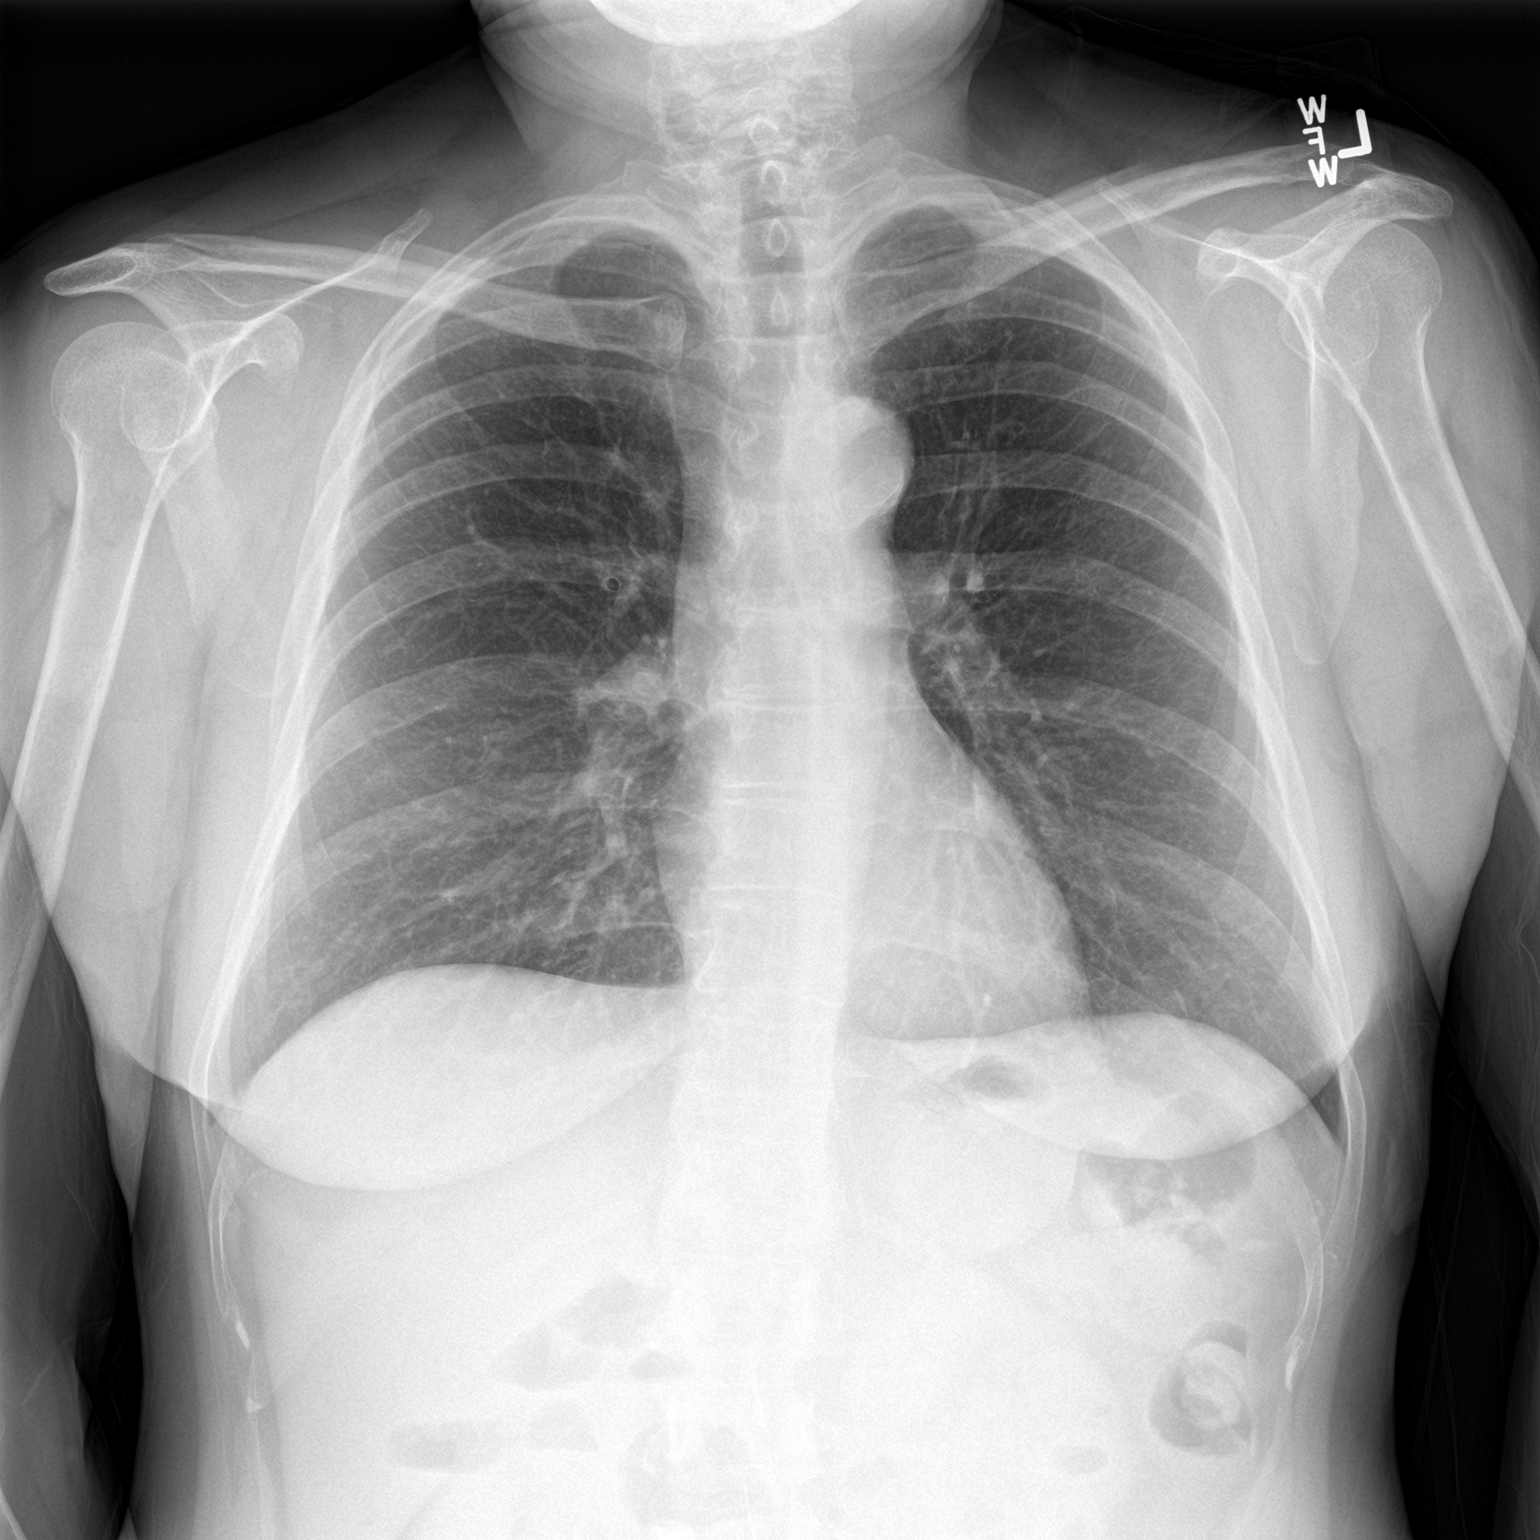

[chest lat]
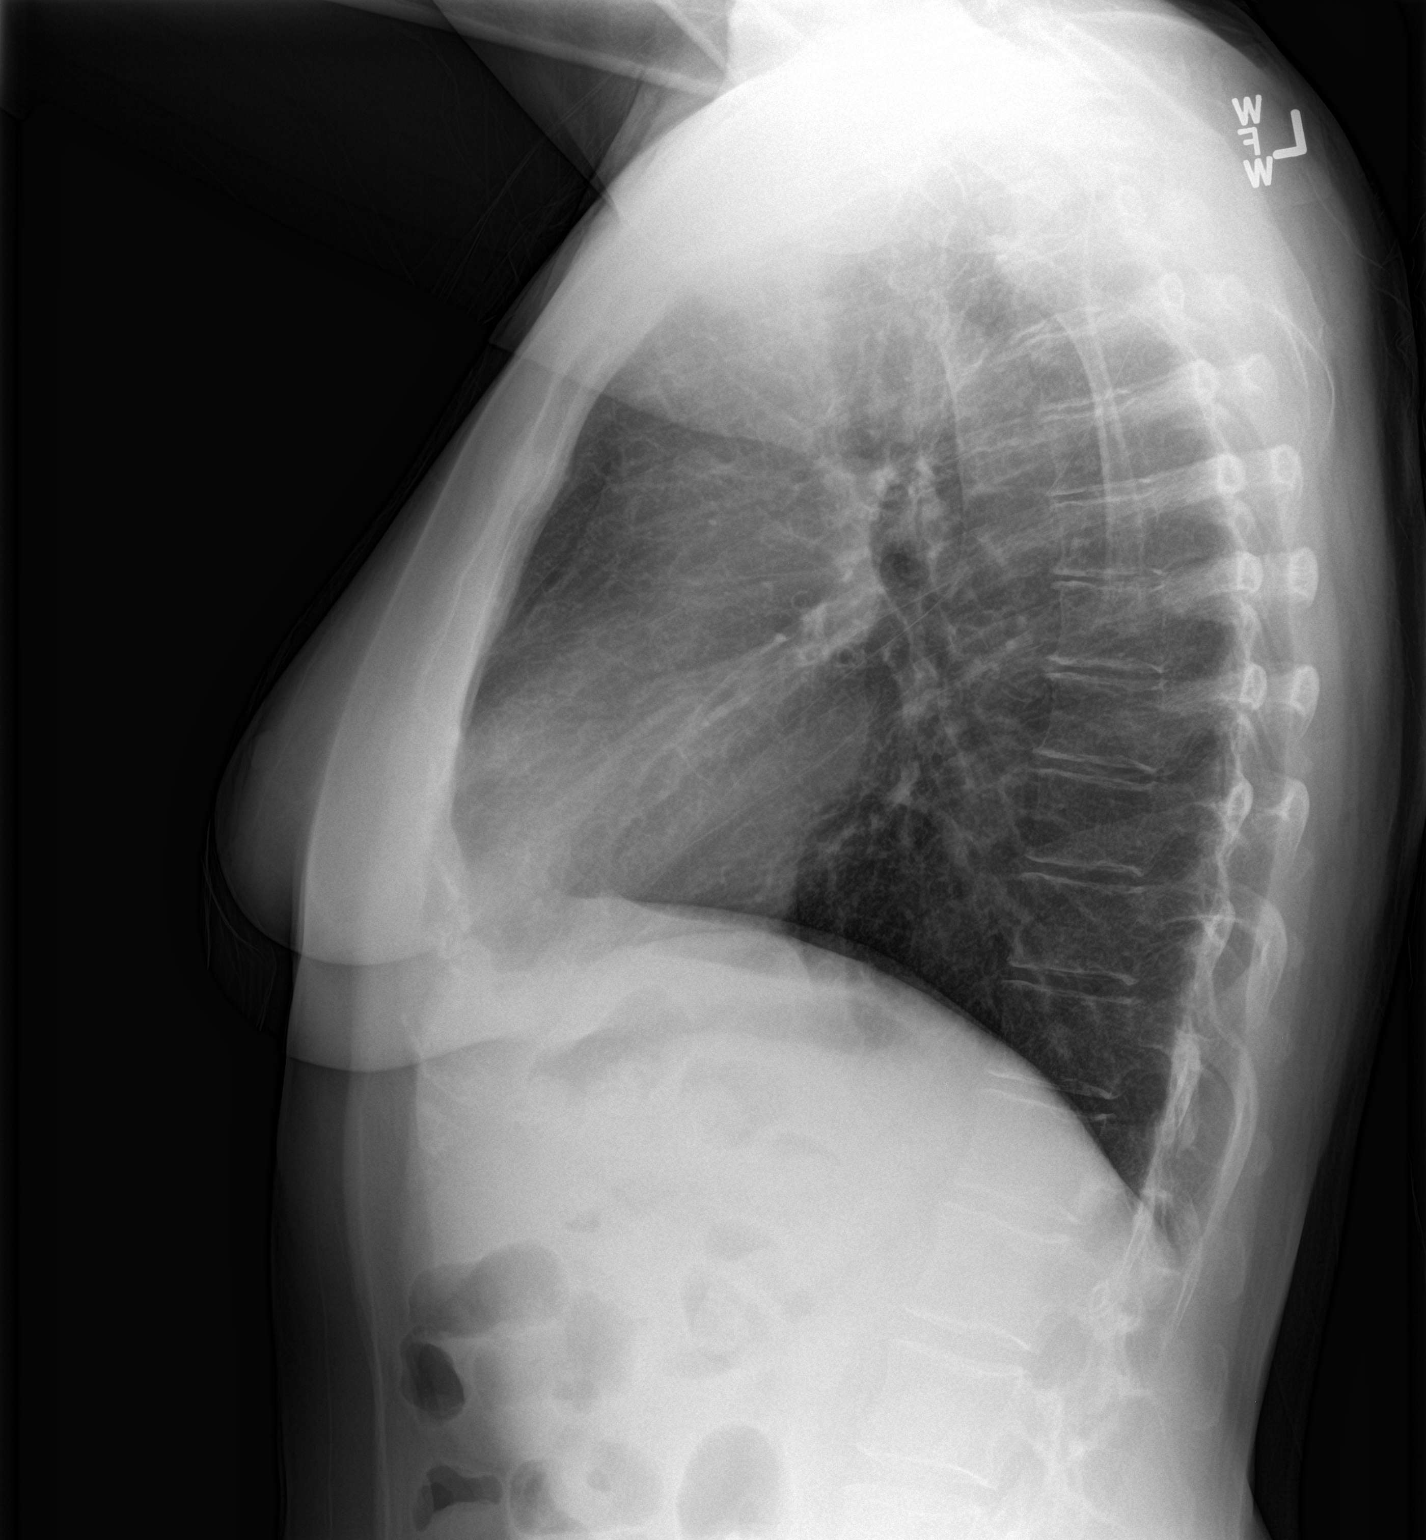

[2 of 2 positions shown; findings below may reference images not displayed]

FINDINGS: The heart size and mediastinal contours are within normal limits.
Both lungs are clear. The visualized skeletal structures are
unremarkable.
IMPRESSION: No active cardiopulmonary disease.

## 2017-05-20 ENCOUNTER — Other Ambulatory Visit: Payer: Self-pay

## 2017-05-20 ENCOUNTER — Emergency Department (HOSPITAL_COMMUNITY)
Admission: EM | Admit: 2017-05-20 | Discharge: 2017-05-20 | Disposition: A | Discharge status: Home / Self Care | Payer: Self-pay | Attending: Emergency Medicine | Admitting: Emergency Medicine

## 2017-05-20 ENCOUNTER — Encounter (HOSPITAL_COMMUNITY): Payer: Self-pay | Admitting: Emergency Medicine

## 2017-05-20 DIAGNOSIS — R070 Pain in throat: Secondary | ICD-10-CM | POA: Insufficient documentation

## 2017-05-20 DIAGNOSIS — Z5321 Procedure and treatment not carried out due to patient leaving prior to being seen by health care provider: Secondary | ICD-10-CM | POA: Insufficient documentation

## 2017-05-20 NOTE — ED Notes (Signed)
Per Magda Paganini, RN poison control   Sx throat burning  Bronchospasms  Consider steroids and breathing treatment  Pt in and states she also called poison control while in [place]  States she will n ot stay and wait and will return if worse and go home and follow Poison controls suggestion  Pt is encouraged to stay but elects to leave

## 2017-05-20 NOTE — ED Triage Notes (Signed)
Pt used sulferic acid to clean her toilet this morning  Inhaled it   Gradual chest burn with cough and inhale   Pt has not been able to smoke

## 2017-05-20 NOTE — ED Notes (Signed)
Call to poison Control [phone]  Magda Paganini, RN POC

## 2018-07-03 ENCOUNTER — Encounter: Payer: Self-pay | Admitting: Gastroenterology

## 2018-09-24 ENCOUNTER — Ambulatory Visit: Payer: Self-pay | Admitting: Nurse Practitioner

## 2018-09-24 ENCOUNTER — Other Ambulatory Visit: Payer: Self-pay

## 2018-09-24 ENCOUNTER — Telehealth: Payer: Self-pay | Admitting: Gastroenterology

## 2018-09-24 ENCOUNTER — Encounter: Payer: Self-pay | Admitting: Gastroenterology

## 2018-09-24 ENCOUNTER — Other Ambulatory Visit (HOSPITAL_COMMUNITY): Payer: Self-pay | Admitting: Adult Health

## 2018-09-24 ENCOUNTER — Ambulatory Visit (HOSPITAL_COMMUNITY)
Admission: RE | Admit: 2018-09-24 | Discharge: 2018-09-24 | Disposition: A | Discharge status: Home / Self Care | Payer: Medicare HMO | Source: Ambulatory Visit | Attending: Adult Health | Admitting: Adult Health

## 2018-09-24 DIAGNOSIS — M542 Cervicalgia: Secondary | ICD-10-CM | POA: Diagnosis not present

## 2018-09-24 NOTE — Progress Notes (Deleted)
Primary Care Physician:  Jani Gravel, MD Primary Gastroenterologist:  Dr. Oneida Alar  No chief complaint on file.   HPI:   [name] is a 63 y.o. female who presents on referral from primary care to schedule colonoscopy.  Nurse/phone triage was deferred to office visit due to medications likely necessitating augmented sedation.  Reviewed information provided with referral including ***.  History of colonoscopy in our system.  Today she states   Past Medical History:  Diagnosis Date  . Coronary artery disease   . Depression   . Hypertension   . Seizures ([place])   . Thyroid disease     Past Surgical History:  Procedure Laterality Date  . APPENDECTOMY    . TUBAL LIGATION      Current Outpatient Medications  Medication Sig Dispense Refill  . divalproex (DEPAKOTE ER) 500 MG 24 hr tablet Take 1 tablet (500 mg total) by mouth at bedtime. 30 tablet 0  . DULoxetine (CYMBALTA) 30 MG capsule Take 1 capsule (30 mg total) by mouth daily. 30 capsule 0  . levothyroxine (SYNTHROID, LEVOTHROID) 75 MCG tablet Take 1 tablet (75 mcg total) by mouth every evening. 30 tablet 0  . lisinopril-hydrochlorothiazide (PRINZIDE,ZESTORETIC) 10-12.5 MG tablet Take 1 tablet by mouth daily. Resume as in outpatient 30 tablet 0  . nicotine (NICODERM CQ - DOSED IN MG/24 HOURS) 21 mg/24hr patch Place 1 patch (21 mg total) onto the skin daily. 28 patch 0  . pravastatin (PRAVACHOL) 20 MG tablet Take 1 tablet (20 mg total) by mouth daily at 6 PM. 30 tablet 0   No current facility-administered medications for this visit.     Allergies as of 09/24/2018 - Review Complete 05/20/2017  Allergen Reaction Noted  . Ambien [zolpidem tartrate]  08/09/2015  . Bee venom Nausea And Vomiting and Swelling 05/20/2014  . Pamelor [nortriptyline hcl]  08/09/2015  . Seroquel [quetiapine fumarate] Swelling 11/28/2014  . Trazodone and nefazodone  08/09/2015  . Buspar [buspirone] Palpitations 11/12/2012  . Wellbutrin [bupropion]  Palpitations 11/12/2012  . Zoloft [sertraline hcl] Palpitations 11/12/2012    Family History  Problem Relation Age of Onset  . Diabetes Mother   . Depression Maternal Uncle     Social History   Socioeconomic History  . Marital status: Married    Spouse name: Not on file  . Number of children: Not on file  . Years of education: Not on file  . Highest education level: Not on file  Occupational History  . Not on file  Social Needs  . Financial resource strain: Not on file  . Food insecurity    Worry: Not on file    Inability: Not on file  . Transportation needs    Medical: Not on file    Non-medical: Not on file  Tobacco Use  . Smoking status: Current Every Day Smoker    Packs/day: 1.00    Types: Cigarettes  . Smokeless tobacco: Never Used  Substance and Sexual Activity  . Alcohol use: No  . Drug use: No  . Sexual activity: Yes    Birth control/protection: Surgical  Lifestyle  . Physical activity    Days per week: Not on file    Minutes per session: Not on file  . Stress: Not on file  Relationships  . Social Herbalist on phone: Not on file    Gets together: Not on file    Attends religious service: Not on file    Active member of club  or organization: Not on file    Attends meetings of clubs or organizations: Not on file    Relationship status: Not on file  . Intimate partner violence    Fear of current or ex partner: Not on file    Emotionally abused: Not on file    Physically abused: Not on file    Forced sexual activity: Not on file  Other Topics Concern  . Not on file  Social History Narrative  . Not on file    Review of Systems: Complete ROS negative except as per HPI.    Physical Exam: There were no vitals taken for this visit. General:   Alert and oriented. Pleasant and cooperative. Well-nourished and well-developed.  Head:  Normocephalic and atraumatic. Eyes:  Without icterus, sclera clear and conjunctiva pink.  Ears:  Normal  auditory acuity. Mouth:  No deformity or lesions, oral mucosa pink.  Throat/Neck:  Supple, without mass or thyromegaly. Cardiovascular:  S1, S2 present without murmurs appreciated. Normal pulses noted. Extremities without clubbing or edema. Respiratory:  Clear to auscultation bilaterally. No wheezes, rales, or rhonchi. No distress.  Gastrointestinal:  +BS, soft, non-tender and non-distended. No HSM noted. No guarding or rebound. No masses appreciated.  Rectal:  Deferred  Musculoskalatal:  Symmetrical without gross deformities. Normal posture. Skin:  Intact without significant lesions or rashes. Neurologic:  Alert and oriented x4;  grossly normal neurologically. Psych:  Alert and cooperative. Normal mood and affect. Heme/Lymph/Immune: No significant cervical adenopathy. No excessive bruising noted.    09/24/2018 9:38 AM   Disclaimer: This note was dictated with voice recognition software. Similar sounding words can inadvertently be transcribed and may not be corrected upon review.

## 2018-09-24 NOTE — Telephone Encounter (Signed)
PATIENT WAS A NO SHOW AND LETTER SENT  °

## 2019-04-24 ENCOUNTER — Other Ambulatory Visit (HOSPITAL_COMMUNITY): Payer: Self-pay | Admitting: Family Medicine

## 2019-04-24 ENCOUNTER — Ambulatory Visit (HOSPITAL_COMMUNITY)
Admission: RE | Admit: 2019-04-24 | Discharge: 2019-04-24 | Disposition: A | Discharge status: Home / Self Care | Payer: Medicare HMO | Source: Ambulatory Visit | Attending: Family Medicine | Admitting: Family Medicine

## 2019-04-24 ENCOUNTER — Other Ambulatory Visit: Payer: Self-pay

## 2019-04-24 DIAGNOSIS — M545 Low back pain, unspecified: Secondary | ICD-10-CM

## 2019-04-24 DIAGNOSIS — M25552 Pain in left hip: Secondary | ICD-10-CM | POA: Diagnosis present

## 2019-04-24 DIAGNOSIS — M25551 Pain in right hip: Secondary | ICD-10-CM | POA: Insufficient documentation

## 2019-05-08 DIAGNOSIS — M25552 Pain in left hip: Secondary | ICD-10-CM | POA: Insufficient documentation

## 2019-05-08 DIAGNOSIS — R7303 Prediabetes: Secondary | ICD-10-CM | POA: Insufficient documentation

## 2019-05-08 DIAGNOSIS — F320 Major depressive disorder, single episode, mild: Secondary | ICD-10-CM | POA: Insufficient documentation

## 2019-05-08 DIAGNOSIS — E039 Hypothyroidism, unspecified: Secondary | ICD-10-CM | POA: Insufficient documentation

## 2019-05-20 ENCOUNTER — Other Ambulatory Visit: Payer: Self-pay | Admitting: Family Medicine

## 2019-05-20 ENCOUNTER — Other Ambulatory Visit (HOSPITAL_COMMUNITY): Payer: Self-pay | Admitting: Family Medicine

## 2019-05-20 DIAGNOSIS — R531 Weakness: Secondary | ICD-10-CM

## 2019-05-20 DIAGNOSIS — M545 Low back pain, unspecified: Secondary | ICD-10-CM

## 2019-05-20 DIAGNOSIS — M79605 Pain in left leg: Secondary | ICD-10-CM

## 2019-05-20 DIAGNOSIS — M79604 Pain in right leg: Secondary | ICD-10-CM

## 2019-06-13 ENCOUNTER — Ambulatory Visit (HOSPITAL_COMMUNITY): Payer: Medicare HMO

## 2019-07-15 ENCOUNTER — Ambulatory Visit (HOSPITAL_COMMUNITY): Payer: Medicare HMO

## 2019-07-15 ENCOUNTER — Encounter (HOSPITAL_COMMUNITY): Payer: Self-pay

## 2019-08-12 ENCOUNTER — Emergency Department (HOSPITAL_COMMUNITY): Payer: Medicare HMO

## 2019-08-12 ENCOUNTER — Emergency Department (HOSPITAL_COMMUNITY)
Admission: EM | Admit: 2019-08-12 | Discharge: 2019-08-12 | Disposition: A | Discharge status: Home / Self Care | Payer: Medicare HMO | Attending: Emergency Medicine | Admitting: Emergency Medicine

## 2019-08-12 ENCOUNTER — Other Ambulatory Visit: Payer: Self-pay

## 2019-08-12 DIAGNOSIS — R0789 Other chest pain: Secondary | ICD-10-CM | POA: Diagnosis not present

## 2019-08-12 DIAGNOSIS — Z5321 Procedure and treatment not carried out due to patient leaving prior to being seen by health care provider: Secondary | ICD-10-CM | POA: Diagnosis not present

## 2019-08-12 NOTE — ED Triage Notes (Signed)
Pt comes in c/o chest pain that started around 7pm only lasted 30 minutes. Worse when she would breathe. But its gone now.

## 2019-12-18 ENCOUNTER — Other Ambulatory Visit (HOSPITAL_COMMUNITY): Payer: Self-pay | Admitting: Family Medicine

## 2019-12-18 DIAGNOSIS — Z1231 Encounter for screening mammogram for malignant neoplasm of breast: Secondary | ICD-10-CM

## 2019-12-18 DIAGNOSIS — Z780 Asymptomatic menopausal state: Secondary | ICD-10-CM

## 2019-12-24 ENCOUNTER — Other Ambulatory Visit (HOSPITAL_COMMUNITY)
Admission: RE | Admit: 2019-12-24 | Discharge: 2019-12-24 | Disposition: A | Discharge status: Home / Self Care | Payer: Medicare HMO | Source: Ambulatory Visit | Attending: Adult Health | Admitting: Adult Health

## 2019-12-24 ENCOUNTER — Encounter: Payer: Self-pay | Admitting: Adult Health

## 2019-12-24 ENCOUNTER — Ambulatory Visit (INDEPENDENT_AMBULATORY_CARE_PROVIDER_SITE_OTHER): Payer: Medicare HMO | Admitting: Adult Health

## 2019-12-24 VITALS — BP 116/59 | Ht 60.0 in | Wt 128.0 lb

## 2019-12-24 DIAGNOSIS — F17210 Nicotine dependence, cigarettes, uncomplicated: Secondary | ICD-10-CM | POA: Insufficient documentation

## 2019-12-24 DIAGNOSIS — Z01419 Encounter for gynecological examination (general) (routine) without abnormal findings: Secondary | ICD-10-CM | POA: Insufficient documentation

## 2019-12-24 DIAGNOSIS — F17200 Nicotine dependence, unspecified, uncomplicated: Secondary | ICD-10-CM | POA: Diagnosis not present

## 2019-12-24 DIAGNOSIS — Z1151 Encounter for screening for human papillomavirus (HPV): Secondary | ICD-10-CM | POA: Insufficient documentation

## 2019-12-24 DIAGNOSIS — Z1211 Encounter for screening for malignant neoplasm of colon: Secondary | ICD-10-CM | POA: Diagnosis not present

## 2019-12-24 DIAGNOSIS — Z124 Encounter for screening for malignant neoplasm of cervix: Secondary | ICD-10-CM | POA: Diagnosis not present

## 2019-12-24 LAB — HEMOCCULT GUIAC POC 1CARD (OFFICE): Fecal Occult Blood, POC: NEGATIVE
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
## 2019-12-25 ENCOUNTER — Encounter: Payer: Self-pay | Admitting: Internal Medicine

## 2019-12-26 LAB — CYTOLOGY - PAP
Comment: NEGATIVE
Diagnosis: NEGATIVE
High risk HPV: NEGATIVE

## 2019-12-31 ENCOUNTER — Telehealth: Payer: Self-pay | Admitting: Adult Health

## 2019-12-31 NOTE — Telephone Encounter (Signed)
Patient had PAP last week with [facility], was told she had some white discharge that could be yeast, tried OTC meds that didn't clear it up, would like Rx sent in - per pt OK to [facility]   Please advise   [facility]

## 2020-01-01 NOTE — Telephone Encounter (Signed)
Left message @ 8:37 am. JSY

## 2020-01-02 MED ORDER — FLUCONAZOLE 150 MG PO TABS
ORAL_TABLET | ORAL | 1 refills | Status: DC
Start: 2020-01-02 — End: 2020-12-16

## 2020-01-02 NOTE — Telephone Encounter (Addendum)
Pt has a vaginal discharge, vulva is inflamed and itching. Pt used a 1 day Monistat and that helped but didn't take it away completely. Can you send in something? Thanks!! [place]

## 2020-01-02 NOTE — Telephone Encounter (Signed)
Will rx diflucan  

## 2020-01-02 NOTE — Addendum Note (Signed)
Addended by: Derrek Monaco A on: 01/02/2020 12:39 PM   Modules accepted: Orders

## 2020-01-22 ENCOUNTER — Encounter: Payer: Self-pay | Admitting: *Deleted

## 2020-01-22 ENCOUNTER — Ambulatory Visit: Payer: Medicare HMO | Admitting: Internal Medicine

## 2020-01-22 ENCOUNTER — Telehealth: Payer: Self-pay | Admitting: *Deleted

## 2020-01-22 ENCOUNTER — Other Ambulatory Visit: Payer: Self-pay

## 2020-01-22 ENCOUNTER — Encounter: Payer: Self-pay | Admitting: Internal Medicine

## 2020-01-22 DIAGNOSIS — Z8711 Personal history of peptic ulcer disease: Secondary | ICD-10-CM

## 2020-01-22 DIAGNOSIS — K5900 Constipation, unspecified: Secondary | ICD-10-CM | POA: Diagnosis not present

## 2020-01-22 DIAGNOSIS — R1032 Left lower quadrant pain: Secondary | ICD-10-CM | POA: Diagnosis not present

## 2020-01-22 NOTE — Progress Notes (Signed)
Primary Care Physician:  Denyce Robert, FNP Primary Gastroenterologist:  Dr. Abbey Chatters  Chief Complaint  Patient presents with  . Abdominal Pain    left lower  . Constipation  . peptic ulcer    HPI:   [name] is a 64 y.o. female who presents to the clinic today by referral from her PCP Denyce Robert for evaluation.  She has multiple GI complaints for me today.  She states she was diagnosed with a peptic ulcer nearly 10 years ago by EGD.  States this flares up rather frequently.  Currently takes famotidine 20 mg 3 times daily.  Does have breakthrough symptoms.  Denies any dysphagia or odynophagia.  No melena or hematochezia.  No chronic NSAID use.  Also notes left lower quadrant abdominal pain which has been going on for 6 months.  Describes 7 out of 10, nonradiating.  Also has chronic constipation.  Has been trying over-the-counter stool softeners with little relief.  No previous colonoscopy.  Denies any unintentional weight loss.  No family history of colorectal malignancy.  Otherwise no other complaints.  Past Medical History:  Diagnosis Date  . Coronary artery disease   . Depression   . Hypertension   . Seizures ([place])   . Thyroid disease     Past Surgical History:  Procedure Laterality Date  . APPENDECTOMY    . TUBAL LIGATION      Current Outpatient Medications  Medication Sig Dispense Refill  . Cholecalciferol (VITAMIN D) 50 MCG (2000 UT) CAPS Take by mouth every other day.    Mariane Baumgarten Calcium (STOOL SOFTENER PO) Take by mouth as needed.    . DULoxetine (CYMBALTA) 30 MG capsule Take 1 capsule (30 mg total) by mouth daily. (Patient taking differently: Take 30 mg by mouth 2 (two) times daily. ) 30 capsule 0  . levothyroxine (SYNTHROID, LEVOTHROID) 75 MCG tablet Take 1 tablet (75 mcg total) by mouth every evening. 30 tablet 0  . lisinopril-hydrochlorothiazide (PRINZIDE,ZESTORETIC) 10-12.5 MG tablet Take 1 tablet by mouth daily. Resume as in outpatient 30 tablet 0    . loratadine (CLARITIN) 10 MG tablet Take 10 mg by mouth daily as needed.     . Multiple Vitamins-Minerals (MULTIVITAMIN GUMMIES ADULT PO) Take by mouth daily.    . Omega-3 1000 MG CAPS Take by mouth daily.    Marland Kitchen OVER THE COUNTER MEDICATION Immune Support Vitamin C 750mg  daily    . potassium chloride (MICRO-K) 10 MEQ CR capsule Take 1 capsule by mouth daily.    . Probiotic Product (PROBIOTIC DAILY PO) Take by mouth daily.    . rosuvastatin (CRESTOR) 10 MG tablet Take 1 tablet by mouth daily.    Marland Kitchen atorvastatin (LIPITOR) 80 MG tablet SMARTSIG:1 Tablet(s) By Mouth Every Evening (Patient not taking: Reported on 01/22/2020)    . fluconazole (DIFLUCAN) 150 MG tablet Take 1 now and 1 in 3 days (Patient not taking: Reported on 01/22/2020) 2 tablet 1   No current facility-administered medications for this visit.    Allergies as of 01/22/2020 - Review Complete 01/22/2020  Allergen Reaction Noted  . Ambien [zolpidem tartrate]  08/09/2015  . Bee venom Nausea And Vomiting and Swelling 05/20/2014  . Pamelor [nortriptyline hcl]  08/09/2015  . Seroquel [quetiapine fumarate] Swelling 11/28/2014  . Trazodone and nefazodone  08/09/2015  . Buspar [buspirone] Palpitations 11/12/2012  . Wellbutrin [bupropion] Palpitations 11/12/2012  . Zoloft [sertraline hcl] Palpitations 11/12/2012    Family History  Problem Relation Age of Onset  .  Diabetes Mother   . Depression Maternal Uncle   . Hypertension Father   . Asthma Son     Social History   Socioeconomic History  . Marital status: Widowed    Spouse name: Not on file  . Number of children: Not on file  . Years of education: Not on file  . Highest education level: Not on file  Occupational History  . Not on file  Tobacco Use  . Smoking status: Current Every Day Smoker    Packs/day: 1.00    Types: Cigarettes  . Smokeless tobacco: Never Used  Vaping Use  . Vaping Use: Never used  Substance and Sexual Activity  . Alcohol use: No  . Drug use: No   . Sexual activity: Not Currently    Birth control/protection: Surgical, Post-menopausal    Comment: tubal  Other Topics Concern  . Not on file  Social History Narrative  . Not on file   Social Determinants of Health   Financial Resource Strain: Medium Risk  . Difficulty of Paying Living Expenses: Somewhat hard  Food Insecurity: Food Insecurity Present  . Worried About Charity fundraiser in the Last Year: Sometimes true  . Ran Out of Food in the Last Year: Sometimes true  Transportation Needs: No Transportation Needs  . Lack of Transportation (Medical): No  . Lack of Transportation (Non-Medical): No  Physical Activity: Inactive  . Days of Exercise per Week: 0 days  . Minutes of Exercise per Session: 0 min  Stress: No Stress Concern Present  . Feeling of Stress : Only a little  Social Connections: Socially Isolated  . Frequency of Communication with Friends and Family: Once a week  . Frequency of Social Gatherings with Friends and Family: Never  . Attends Religious Services: More than 4 times per year  . Active Member of Clubs or Organizations: No  . Attends Archivist Meetings: Never  . Marital Status: Widowed  Intimate Partner Violence: Not At Risk  . Fear of Current or Ex-Partner: No  . Emotionally Abused: No  . Physically Abused: No  . Sexually Abused: No    Subjective: Review of Systems  Constitutional: Negative for chills and fever.  HENT: Negative for congestion and hearing loss.   Eyes: Negative for blurred vision and double vision.  Respiratory: Negative for cough and shortness of breath.   Cardiovascular: Negative for chest pain and palpitations.  Gastrointestinal: Positive for abdominal pain, constipation and heartburn. Negative for blood in stool, diarrhea, melena and vomiting.  Genitourinary: Negative for dysuria and urgency.  Musculoskeletal: Negative for joint pain and myalgias.  Skin: Negative for itching and rash.  Neurological: Negative for  dizziness and headaches.  Psychiatric/Behavioral: Negative for depression. The patient is not nervous/anxious.        Objective: BP 122/68   Pulse 93   Temp (!) 96.9 F (36.1 C) (Temporal)   Ht 5' (1.524 m)   Wt 126 lb (57.2 kg)   BMI 24.61 kg/m  Physical Exam Constitutional:      Appearance: Normal appearance.  HENT:     Head: Normocephalic and atraumatic.  Eyes:     Extraocular Movements: Extraocular movements intact.     Conjunctiva/sclera: Conjunctivae normal.  Cardiovascular:     Rate and Rhythm: Normal rate and regular rhythm.  Pulmonary:     Effort: Pulmonary effort is normal.     Breath sounds: Normal breath sounds.  Abdominal:     General: Bowel sounds are normal.  Palpations: Abdomen is soft.  Musculoskeletal:        General: No swelling. Normal range of motion.     Cervical back: Normal range of motion and neck supple.  Skin:    General: Skin is warm and dry.     Coloration: Skin is not jaundiced.  Neurological:     General: No focal deficit present.     Mental Status: She is alert and oriented to person, place, and time.  Psychiatric:        Mood and Affect: Mood normal.        Behavior: Behavior normal.      Assessment: *Left lower quadrant abdominal pain *Chronic constipation *Chronic GERD-uncontrolled *History of peptic ulcer *Chronic tobacco use  Plan: Etiology of patient's abdominal pain unclear, Will schedule for EGD to evaluate for peptic ulcer disease, esophagitis, gastritis, H. Pylori, duodenitis, or other. Will also evaluate for esophageal stricture, Schatzki's ring, esophageal web or other.   At the same time, we will perform colonoscopy to rule out underlying inflammatory bowel disease, polyps, malignancy, or other.  The risks including infection, bleed, or perforation as well as benefits, limitations, alternatives and imponderables have been reviewed with the patient. Potential for esophageal dilation, biopsy, etc. have also been  reviewed.  Questions have been answered. All parties agreeable.  Continue on famotidine for now.  We may need to start her on PPI therapy pending endoscopic evaluation.  For her chronic constipation, I will trial her on Linzess 72 mcg daily.  Samples provided in clinic today.  If this helps she will call office and I will send in a formal prescription.  Spent 5 minutes discussing tobacco use.  Recommend cessation with the help of nicotine gums, lozenges, patches.  Patient seems like she wants to quit, though I am unclear how motivated she is.  We will continue to address this.  Thank you Denyce Robert for the kind referral.  01/22/2020 2:14 PM   Disclaimer: This note was dictated with voice recognition software. Similar sounding words can inadvertently be transcribed and may not be corrected upon review.

## 2020-01-22 NOTE — Telephone Encounter (Signed)
PA approved via [employer] for TCS/EGD. Auth# [id number] DOS 03/17/2020-04/16/2020

## 2020-01-22 NOTE — Patient Instructions (Addendum)
We will schedule you for EGD to check on the peptic ulcer as well as your chronic heartburn/reflux.  At the same time we will perform colonoscopy to evaluate your abdominal pain.  Linzess 72 mcg samples given today.  If this helps your constipation, please call office and I will send in formal prescription.  Further recommendations to follow.  At [facility] we value your feedback. You may receive a survey about your visit today. Please share your experience as we strive to create trusting relationships with our patients to provide genuine, compassionate, quality care.  We appreciate your understanding and patience as we review any laboratory studies, imaging, and other diagnostic tests that are ordered as we care for you. Our office policy is 5 business days for review of these results, and any emergent or urgent results are addressed in a timely manner for your best interest. If you do not hear from our office in 1 week, please contact us.   We also encourage the use of MyChart, which contains your medical information for your review as well. If you are not enrolled in this feature, an access code is on this after visit summary for your convenience. Thank you for allowing [place] to be involved in your care.  It was great to see you today!  I hope you have a great rest of your fall!!    Hawa Henly K. Abbey Chatters, D.O. Gastroenterology and Hepatology [facility]

## 2020-01-27 ENCOUNTER — Telehealth: Payer: Self-pay | Admitting: Internal Medicine

## 2020-01-27 NOTE — Telephone Encounter (Addendum)
Called pt, no answer and no VM 

## 2020-01-27 NOTE — Telephone Encounter (Signed)
Pt wants to cancel her colonscopy/EGD with Dr Abbey Chatters on 03/17/2020 due to being told she has to pay $600 up front. [phone]

## 2020-01-27 NOTE — Telephone Encounter (Signed)
Pt called office, [employer] told her she would have to pay $600 up front. Gave her phone# to call pre-service center. She will let our office know if she needs to cancel procedure.

## 2020-02-04 ENCOUNTER — Telehealth: Payer: Self-pay

## 2020-02-04 ENCOUNTER — Telehealth: Payer: Self-pay | Admitting: Internal Medicine

## 2020-02-04 MED ORDER — LINACLOTIDE 72 MCG PO CAPS: 72.0000 ug | ORAL_CAPSULE | Freq: Every day | ORAL | 3 refills | Status: DC

## 2020-02-04 NOTE — Telephone Encounter (Signed)
[phone]/[id number] called to cancel her procedure, and ask for a prescription of linzess 90 day supply to be  sent to [facility] in [place]

## 2020-02-04 NOTE — Telephone Encounter (Signed)
Called pt, she did not want to r/s procedure at this time. Procedure cancelled. Called endo and LMOVM making aware

## 2020-02-04 NOTE — Telephone Encounter (Signed)
Linzess 72 mcg once daily sent to pharmacy.

## 2020-02-04 NOTE — Addendum Note (Signed)
Addended by: Annitta Needs on: 02/04/2020 04:33 PM   Modules accepted: Orders

## 2020-02-04 NOTE — Telephone Encounter (Signed)
FYI: PT CANCELLED PROCEDURE. PT WANTS Rx FOR LINZESS 85mcg SENT TO HER PHARMACY. STATES THE LINZESS IS HELPING HERE.

## 2020-02-05 NOTE — Telephone Encounter (Signed)
PHONED AND ADVISED PT THAT Rx WAS PHONED IN TO HER PHARMACY.

## 2020-02-10 ENCOUNTER — Ambulatory Visit: Payer: Medicare HMO | Admitting: Adult Health

## 2020-03-12 ENCOUNTER — Other Ambulatory Visit (HOSPITAL_COMMUNITY): Payer: Medicare HMO

## 2020-03-17 ENCOUNTER — Ambulatory Visit (HOSPITAL_COMMUNITY): Admit: 2020-03-17 | Payer: Medicare HMO

## 2020-03-17 ENCOUNTER — Encounter (HOSPITAL_COMMUNITY): Payer: Self-pay

## 2020-03-17 SURGERY — COLONOSCOPY WITH PROPOFOL
Anesthesia: Monitor Anesthesia Care

## 2020-07-09 ENCOUNTER — Encounter: Payer: Self-pay | Admitting: Adult Health

## 2020-07-09 ENCOUNTER — Ambulatory Visit (INDEPENDENT_AMBULATORY_CARE_PROVIDER_SITE_OTHER): Payer: Medicare Other | Admitting: Adult Health

## 2020-07-09 ENCOUNTER — Other Ambulatory Visit: Payer: Self-pay

## 2020-07-09 ENCOUNTER — Other Ambulatory Visit (HOSPITAL_COMMUNITY)
Admission: RE | Admit: 2020-07-09 | Discharge: 2020-07-09 | Disposition: A | Discharge status: Home / Self Care | Payer: Medicare Other | Source: Ambulatory Visit | Attending: Adult Health | Admitting: Adult Health

## 2020-07-09 VITALS — BP 114/71 | HR 71 | Ht 60.0 in | Wt 125.6 lb

## 2020-07-09 DIAGNOSIS — N952 Postmenopausal atrophic vaginitis: Secondary | ICD-10-CM | POA: Insufficient documentation

## 2020-07-09 DIAGNOSIS — N898 Other specified noninflammatory disorders of vagina: Secondary | ICD-10-CM | POA: Diagnosis present

## 2020-07-09 NOTE — Progress Notes (Signed)
  Subjective:     Patient ID: MACK THURMON, female   DOB: [birth year], 65 y.o.   MRN: [id number]  HPI [name] is a 65 year old white female, widowed,PM in complaining of recurrent yeast, treated with diflucan without relief. Has vaginal itching,no odor. No sex in over 5 years. PCP is [employer].  Review of Systems +vaginal itching No odor Not sexually active Reviewed past medical,surgical, social and family history. Reviewed medications and allergies.     Objective:   Physical Exam BP 114/71 (BP Location: Right Arm, Patient Position: Sitting, Cuff Size: Normal)   Pulse 71   Ht 5' (1.524 m)   Wt 125 lb 9.6 oz (57 kg)   BMI 24.53 kg/m    Skin warm and dry.Pelvic: external genitalia is normal in appearance no lesions, vagina: pale and dry, with loss of rugae,urethra has no lesions or masses noted, cervix:smooth, uterus: normal size, shape and contour, non tender, no masses felt, adnexa: no masses or tenderness noted. Bladder is non tender and no masses felt. CV swab obtained.  Upstream - 07/09/20 1054      Pregnancy Intention Screening   Does the patient want to become pregnant in the next year? No    Does the patient's partner want to become pregnant in the next year? No    Would the patient like to discuss contraceptive options today? No      Contraception Wrap Up   Current Method Female Sterilization   post-menopausal   End Method Female Sterilization   pm   Contraception Counseling Provided No         Examination chaperoned by Engineer, materials Assessment:     1. Vaginal itching CV swab sent for yeast,BV and trich  2. Vaginal atrophy Discussed estrogen cream she wants to wait til swab back     Plan:     Follow up prn

## 2020-07-10 LAB — CERVICOVAGINAL ANCILLARY ONLY
Bacterial Vaginitis (gardnerella): POSITIVE — AB
Candida Glabrata: NEGATIVE
Candida Vaginitis: NEGATIVE
Comment: NEGATIVE
Comment: NEGATIVE
Comment: NEGATIVE
Comment: NEGATIVE
Trichomonas: NEGATIVE

## 2020-07-13 ENCOUNTER — Telehealth: Payer: Self-pay | Admitting: Adult Health

## 2020-07-13 MED ORDER — METRONIDAZOLE 500 MG PO TABS
500.0000 mg | ORAL_TABLET | Freq: Two times a day (BID) | ORAL | 0 refills | Status: DC
Start: 2020-07-13 — End: 2021-07-19

## 2020-07-13 NOTE — Telephone Encounter (Signed)
Pt aware that vaginal swab +BV no yeast will rx flagyl, no alcohol

## 2020-09-07 ENCOUNTER — Emergency Department (HOSPITAL_COMMUNITY)
Admission: EM | Admit: 2020-09-07 | Discharge: 2020-09-07 | Disposition: A | Discharge status: Home / Self Care | Payer: Medicare Other | Attending: Emergency Medicine | Admitting: Emergency Medicine

## 2020-09-07 ENCOUNTER — Encounter (HOSPITAL_COMMUNITY): Payer: Self-pay | Admitting: Emergency Medicine

## 2020-09-07 ENCOUNTER — Emergency Department (HOSPITAL_COMMUNITY): Payer: Medicare Other

## 2020-09-07 ENCOUNTER — Other Ambulatory Visit: Payer: Self-pay

## 2020-09-07 DIAGNOSIS — F17210 Nicotine dependence, cigarettes, uncomplicated: Secondary | ICD-10-CM | POA: Diagnosis not present

## 2020-09-07 DIAGNOSIS — S299XXA Unspecified injury of thorax, initial encounter: Secondary | ICD-10-CM | POA: Insufficient documentation

## 2020-09-07 DIAGNOSIS — Z79899 Other long term (current) drug therapy: Secondary | ICD-10-CM | POA: Insufficient documentation

## 2020-09-07 DIAGNOSIS — W01198A Fall on same level from slipping, tripping and stumbling with subsequent striking against other object, initial encounter: Secondary | ICD-10-CM | POA: Diagnosis not present

## 2020-09-07 DIAGNOSIS — M25512 Pain in left shoulder: Secondary | ICD-10-CM | POA: Diagnosis not present

## 2020-09-07 DIAGNOSIS — I119 Hypertensive heart disease without heart failure: Secondary | ICD-10-CM | POA: Insufficient documentation

## 2020-09-07 DIAGNOSIS — I2510 Atherosclerotic heart disease of native coronary artery without angina pectoris: Secondary | ICD-10-CM | POA: Diagnosis not present

## 2020-09-07 DIAGNOSIS — R0781 Pleurodynia: Secondary | ICD-10-CM

## 2020-09-07 MED ORDER — OXYCODONE-ACETAMINOPHEN 5-325 MG PO TABS: 1.0000 | ORAL_TABLET | Freq: Three times a day (TID) | ORAL | 0 refills | Status: AC | PRN

## 2020-09-07 NOTE — ED Provider Notes (Signed)
[facility] EMERGENCY DEPARTMENT Provider Note   CSN: [id number] Arrival date & time: 09/07/20  1132     History Chief Complaint  Patient presents with   Rib Injury    [name] is a 65 y.o. female.  HPI  Patient with significant medical history of CAD, hypertension, seizures presents with chief complaint of left-sided rib pain.  Patient states that this started on Saturday, she endorses that she had a mechanical fall, tripped over a cooler and landed onto her left ribs.  She denies hitting her head, losing conscious, is not on anticoagulant.  Patient states after the incident she had some left rib pain, but over the last few days it has progressively gotten worse.  She states that it hurts when she takes in a deep breath but denies actual becoming short of breath.  She has been trying over-the-counter pain medication without any real relief.  She denies neck, back pain, but does note some pain on her left scapula, she denies alleviating factors.  Patient denies headaches, fevers, chills, shortness of breath, abdominal pain.  Past Medical History:  Diagnosis Date   Coronary artery disease    Depression    History of syphilis    Hypertension    Seizures ([place])    Thyroid disease     Patient Active Problem List   Diagnosis Date Noted   Vaginal atrophy 07/09/2020   Vaginal itching 07/09/2020   History of peptic ulcer 01/22/2020   Constipation 01/22/2020   LLQ abdominal pain 01/22/2020   Encounter for screening fecal occult blood testing 12/24/2019   Encounter for gynecological examination with Papanicolaou smear of cervix 12/24/2019   Routine cervical smear 12/24/2019   Smoker 12/24/2019   Bipolar disorder with severe depression ([place]) 08/09/2015    Past Surgical History:  Procedure Laterality Date   APPENDECTOMY     TUBAL LIGATION       OB History     Gravida  6   Para      Term      Preterm      AB  2   Living  4      SAB      IAB  2   Ectopic       Multiple      Live Births  4           Family History  Problem Relation Age of Onset   Diabetes Mother    Depression Maternal Uncle    Hypertension Father    Asthma Son     Social History   Tobacco Use   Smoking status: Every Day    Packs/day: 1.00    Pack years: 0.00    Types: Cigarettes   Smokeless tobacco: Never  Vaping Use   Vaping Use: Never used  Substance Use Topics   Alcohol use: No   Drug use: No    Home Medications Prior to Admission medications   Medication Sig Start Date End Date Taking? Authorizing Provider  oxyCODONE-acetaminophen (PERCOCET/ROXICET) 5-325 MG tablet Take 1 tablet by mouth every 8 (eight) hours as needed for up to 5 days for severe pain. 09/07/20 09/12/20 Yes Marcello Fennel, PA-C  albuterol (VENTOLIN HFA) 108 [phone] Base) MCG/ACT inhaler  01/15/20   [provider]  atorvastatin (LIPITOR) 80 MG tablet SMARTSIG:1 Tablet(s) By Mouth Every Evening 06/30/19   [provider]  Cholecalciferol (VITAMIN D) 50 MCG (2000 UT) CAPS Take by mouth every other day.  [provider]  Docusate Calcium (STOOL SOFTENER PO) Take by mouth as needed.    [provider]  DULoxetine (CYMBALTA) 30 MG capsule Take 1 capsule (30 mg total) by mouth daily. Patient taking differently: Take 30 mg by mouth 2 (two) times daily. 08/11/15   Kerrie Buffalo, NP  EUTHYROX 100 MCG tablet Take 100 mcg by mouth every morning. 06/06/20   [provider]  famotidine (PEPCID) 20 MG tablet Take 20 mg by mouth 2 (two) times daily. 05/11/20   [provider]  fluconazole (DIFLUCAN) 150 MG tablet Take 1 now and 1 in 3 days 01/02/20   Estill Dooms, NP  linaclotide [facility]) 72 MCG capsule Take 1 capsule (72 mcg total) by mouth daily before breakfast. 02/04/20   Annitta Needs, NP  lisinopril-hydrochlorothiazide (PRINZIDE,ZESTORETIC) 10-12.5 MG tablet Take 1 tablet by mouth daily. Resume as in outpatient 08/11/15   Kerrie Buffalo, NP  loratadine (CLARITIN) 10 MG tablet Take 10 mg by mouth daily as needed.     [provider]  metroNIDAZOLE (FLAGYL) 500 MG tablet Take 1 tablet (500 mg total) by mouth 2 (two) times daily. 07/13/20   Estill Dooms, NP  mirtazapine (REMERON) 15 MG tablet Take 7.5 mg by mouth at bedtime as needed. 07/07/20   [provider]  Multiple Vitamins-Minerals (MULTIVITAMIN GUMMIES ADULT PO) Take by mouth daily.    [provider]  Omega-3 1000 MG CAPS Take by mouth daily.    [provider]  OVER THE COUNTER MEDICATION Immune Support Vitamin C 750mg  daily    [provider]  potassium chloride (MICRO-K) 10 MEQ CR capsule Take 1 capsule by mouth daily. 01/15/20   [provider]  Probiotic Product (PROBIOTIC DAILY PO) Take by mouth daily.    [provider]  rosuvastatin (CRESTOR) 10 MG tablet Take 1 tablet by mouth daily. 01/15/20   [provider]    Allergies    Ambien [zolpidem tartrate], Bee venom, Pamelor [nortriptyline hcl], Seroquel [quetiapine fumarate], Trazodone and nefazodone, Buspar [buspirone], Wellbutrin [bupropion], and Zoloft [sertraline hcl]  Review of Systems   Review of Systems  Constitutional:  Negative for chills and fever.  HENT:  Negative for congestion.   Respiratory:  Negative for shortness of breath.   Cardiovascular:  Negative for chest pain.  Gastrointestinal:  Negative for abdominal pain and vomiting.  Genitourinary:  Negative for enuresis.  Musculoskeletal:  Negative for back pain.       Left rib and left scapula pain.  Skin:  Negative for rash.  Neurological:  Negative for headaches.  Hematological:  Does not bruise/bleed easily.   Physical Exam Updated Vital Signs BP 116/70   Pulse 87   Temp 98.9 F (37.2 C)   Resp 20   Ht 5\' 2"  (1.575 m)   Wt 56.8 kg   SpO2 97%   BMI 22.90 kg/m   Physical Exam Vitals and nursing note reviewed.  Constitutional:      General: She  is not in acute distress.    Appearance: She is not ill-appearing.  HENT:     Head: Normocephalic and atraumatic.     Nose: No congestion.  Eyes:     Conjunctiva/sclera: Conjunctivae normal.  Cardiovascular:     Rate and Rhythm: Normal rate and regular rhythm.     Pulses: Normal pulses.     Heart sounds: No murmur heard.   No friction rub. No gallop.  Pulmonary:     Effort: No  respiratory distress.     Breath sounds: No wheezing, rhonchi or rales.  Musculoskeletal:     Comments: Patient is full range of motion in the upper and lower extremities.    Patient spine was palpated was nontender to palpation, no step-off or deformities present.  Patient did have some tenderness along her scapula along the medial border, there is no gross deformities present.    Patient's chest was palpated there is no gross deformities present, she is  tender at the mid clavicular line along the left fourth and fifth rib.  Skin:    General: Skin is warm and dry.  Neurological:     Mental Status: She is alert.  Psychiatric:        Mood and Affect: Mood normal.    ED Results / Procedures / Treatments   Labs (all labs ordered are listed, but only abnormal results are displayed) Labs Reviewed - No data to display  EKG EKG Interpretation  Date/Time:  Monday September 07 2020 11:52:50 EDT Ventricular Rate:  78 PR Interval:  148 QRS Duration: 88 QT Interval:  384 QTC Calculation: 437 R Axis:   48 Text Interpretation: Normal sinus rhythm Normal ECG Confirmed by Noemi Chapel [phone]) on 09/07/2020 11:56:21 AM  Radiology DG Ribs Unilateral W/Chest Left  Result Date: 09/07/2020 CLINICAL DATA:  Acute LEFT chest and rib pain following fall several days ago. Initial encounter. EXAM: LEFT RIBS AND CHEST - 3+ VIEW COMPARISON:  11/28/2014 chest radiograph and prior studies FINDINGS: No fracture or other bone lesions are seen involving the ribs. There is no evidence of pneumothorax or pleural effusion. Both lungs  are clear. Heart size and mediastinal contours are within normal limits. IMPRESSION: Negative. Electronically Signed   By: Margarette Canada M.D.   On: 09/07/2020 13:00   DG Shoulder Left  Result Date: 09/07/2020 CLINICAL DATA:  Acute LEFT shoulder pain following fall several days ago. Initial encounter. EXAM: LEFT SHOULDER - 2+ VIEW COMPARISON:  None. FINDINGS: There is no evidence of fracture or dislocation. There is no evidence of arthropathy or other focal bone abnormality. Soft tissues are unremarkable. IMPRESSION: Negative. Electronically Signed   By: Margarette Canada M.D.   On: 09/07/2020 13:01    Procedures Procedures   Medications Ordered in ED Medications - No data to display  ED Course  I have reviewed the triage vital signs and the nursing notes.  Pertinent labs & imaging results that were available during my care of the patient were reviewed by me and considered in my medical decision making (see chart for details).    MDM Rules/Calculators/A&P                         Initial impression-patient presents with left rib and left scapula pain.  She is alert, does not appear in acute distress, vital signs reassuring.  Will obtain imaging of her ribs as well as shoulder for further evaluation.  Work-up-unilateral rib x-ray negative for acute findings.  Left shoulder negative for acute findings.  Reassessment-updated patient on imaging, she had no questions about her imaging.  She does endorse that she was without water for the last 3 weeks, states that she is having difficulty with her family.  Will have an amatory referral for social work to contact the patient for further investigation.  Rule out-I have low suspicion for spinal cord abnormality or spinal fracture spine was palpated was nontender to palpation, patient is moving all 4 extremities.  Low suspicion for pneumothorax or rib fracture as lung sounds were clear bilaterally, x-rays negative for acute findings.  Low suspicion for  fracture of the left shoulder or scapula as there is no acute abnormalities present.  I have low suspicion for compartment syndrome as compartments were soft and nontender, neurovascular fully intact.  Plan-  Fall-suspect patient's left rib pain and shoulder pain are muscular in nature but I cannot fully exclude the possibility of a occult fracture.  Will provide patient with narcotics, incentive spirometry and have her follow-up with PCP next 3 weeks for reevaluation.  Vital signs have remained stable, no indication for hospital admission.   Patient given at home care as well strict return precautions.  Patient verbalized that they understood agreed to said plan.  Final Clinical Impression(s) / ED Diagnoses Final diagnoses:  Rib pain  Acute pain of left shoulder    Rx / DC Orders ED Discharge Orders          Ordered    oxyCODONE-acetaminophen (PERCOCET/ROXICET) 5-325 MG tablet  Every 8 hours PRN        09/07/20 1319    Ambulatory referral to Social Work        09/07/20 [place], Cathy Ropp J, PA-C 09/07/20 1323    Noemi Chapel, MD 09/08/20 [phone]

## 2020-09-07 NOTE — ED Triage Notes (Signed)
Pt tripped and fell on Saturday. C/o left rib pain worse with deep breath.

## 2020-09-07 NOTE — Discharge Instructions (Addendum)
Your imaging looks reassuring.  It is possible that you may have a small fracture or a bruised rib.  I have given you pain medication please take as prescribed.  Please be aware that this medication make you drowsy do not consume alcohol or operate heavy machinery when taking this medication.  This medication has Tylenol in it do not take Tylenol when taking this medication.  I also have given you a incentive spirometer please use 3 times daily for next 3 weeks this will help prevent pneumonia.    Like to follow-up with your PCP in 3 time for repeat chest x-ray.  Social work should contact you for assistance with your water issue.  Come back to the emergency department if you develop chest pain, shortness of breath, severe abdominal pain, uncontrolled nausea, vomiting, diarrhea.

## 2020-09-09 DIAGNOSIS — M791 Myalgia, unspecified site: Secondary | ICD-10-CM | POA: Insufficient documentation

## 2020-09-09 DIAGNOSIS — R0781 Pleurodynia: Secondary | ICD-10-CM | POA: Insufficient documentation

## 2020-12-16 ENCOUNTER — Other Ambulatory Visit: Payer: Self-pay

## 2020-12-16 ENCOUNTER — Other Ambulatory Visit (HOSPITAL_COMMUNITY)
Admission: RE | Admit: 2020-12-16 | Discharge: 2020-12-16 | Disposition: A | Discharge status: Home / Self Care | Payer: Medicare Other | Source: Ambulatory Visit | Attending: Adult Health | Admitting: Adult Health

## 2020-12-16 ENCOUNTER — Ambulatory Visit (INDEPENDENT_AMBULATORY_CARE_PROVIDER_SITE_OTHER): Payer: Medicare Other | Admitting: Adult Health

## 2020-12-16 ENCOUNTER — Encounter: Payer: Self-pay | Admitting: Adult Health

## 2020-12-16 VITALS — BP 92/53 | HR 84 | Ht 60.0 in | Wt 116.0 lb

## 2020-12-16 DIAGNOSIS — N898 Other specified noninflammatory disorders of vagina: Secondary | ICD-10-CM | POA: Insufficient documentation

## 2020-12-16 DIAGNOSIS — Z113 Encounter for screening for infections with a predominantly sexual mode of transmission: Secondary | ICD-10-CM | POA: Diagnosis present

## 2020-12-16 NOTE — Progress Notes (Signed)
  Subjective:     Patient ID: [name], female   DOB: 10-05-1955, 65 y.o.   MRN: [id number]  HPI [name] is a 65 year old biracial female, widowed, PM in complaining of vaginal discharge, has odor at times and white spot on labia. She had oral sex and is worried. She says she has lost about 10 lbs in last 2-3 months and is stressed. PCP is [facility].  Review of Systems +vaginal discharge with odor Reviewed past medical,surgical, social and family history. Reviewed medications and allergies.     Objective:   Physical Exam BP (!) 92/53 (BP Location: Left Arm, Patient Position: Sitting, Cuff Size: Normal)   Pulse 84   Ht 5' (1.524 m)   Wt 116 lb (52.6 kg)   BMI 22.65 kg/m     Skin warm and dry.Pelvic: external genitalia is normal in appearance no lesions, vagina: white discharge without odor,has white area at old episiotomy site,urethra has no lesions or masses noted, cervix:smooth, uterus: normal size, shape and contour, non tender, no masses felt, adnexa: no masses or tenderness noted. Bladder is non tender and no masses felt. CV swab obtained. Examination chaperoned by [facility] RN. Fall risk is low Personnel officer Visit from 12/16/2020 in [facility]  PHQ-2 Total Score 0        Upstream - 12/16/20 1514       Pregnancy Intention Screening   Does the patient want to become pregnant in the next year? N/A    Does the patient's partner want to become pregnant in the next year? N/A    Would the patient like to discuss contraceptive options today? No      Contraception Wrap Up   Current Method Female Sterilization    End Method Female Sterilization    Contraception Counseling Provided No             Assessment:    1. Vaginal discharge CV swab sent  2. Screening examination for STD (sexually transmitted disease) CV swab sent for GC/CHL,trich ,BV and yeast   Plan:    Will talk when results back Follow up prn

## 2020-12-18 LAB — CERVICOVAGINAL ANCILLARY ONLY
Bacterial Vaginitis (gardnerella): NEGATIVE
Candida Glabrata: NEGATIVE
Candida Vaginitis: NEGATIVE
Chlamydia: NEGATIVE
Comment: NEGATIVE
Comment: NEGATIVE
Comment: NEGATIVE
Comment: NEGATIVE
Comment: NEGATIVE
Comment: NORMAL
Neisseria Gonorrhea: NEGATIVE
Trichomonas: NEGATIVE

## 2021-02-01 ENCOUNTER — Other Ambulatory Visit (HOSPITAL_COMMUNITY): Payer: Self-pay | Admitting: Adult Health

## 2021-02-01 DIAGNOSIS — Z1231 Encounter for screening mammogram for malignant neoplasm of breast: Secondary | ICD-10-CM

## 2021-02-09 ENCOUNTER — Encounter: Payer: Self-pay | Admitting: Internal Medicine

## 2021-02-12 ENCOUNTER — Ambulatory Visit (HOSPITAL_COMMUNITY): Payer: Medicare Other

## 2021-02-12 ENCOUNTER — Other Ambulatory Visit: Payer: Self-pay

## 2021-02-12 ENCOUNTER — Ambulatory Visit (HOSPITAL_COMMUNITY)
Admission: RE | Admit: 2021-02-12 | Discharge: 2021-02-12 | Disposition: A | Discharge status: Home / Self Care | Payer: Medicare Other | Source: Ambulatory Visit | Attending: Adult Health | Admitting: Adult Health

## 2021-02-12 DIAGNOSIS — Z1231 Encounter for screening mammogram for malignant neoplasm of breast: Secondary | ICD-10-CM | POA: Diagnosis not present

## 2021-03-09 ENCOUNTER — Emergency Department (HOSPITAL_COMMUNITY): Payer: Medicare Other

## 2021-03-09 ENCOUNTER — Emergency Department (HOSPITAL_COMMUNITY)
Admission: EM | Admit: 2021-03-09 | Discharge: 2021-03-09 | Disposition: A | Discharge status: Home / Self Care | Payer: Medicare Other | Attending: Emergency Medicine | Admitting: Emergency Medicine

## 2021-03-09 ENCOUNTER — Other Ambulatory Visit: Payer: Self-pay

## 2021-03-09 DIAGNOSIS — R519 Headache, unspecified: Secondary | ICD-10-CM | POA: Diagnosis not present

## 2021-03-09 DIAGNOSIS — F17210 Nicotine dependence, cigarettes, uncomplicated: Secondary | ICD-10-CM | POA: Diagnosis not present

## 2021-03-09 DIAGNOSIS — I119 Hypertensive heart disease without heart failure: Secondary | ICD-10-CM | POA: Diagnosis not present

## 2021-03-09 DIAGNOSIS — Y92410 Unspecified street and highway as the place of occurrence of the external cause: Secondary | ICD-10-CM | POA: Diagnosis not present

## 2021-03-09 DIAGNOSIS — I2510 Atherosclerotic heart disease of native coronary artery without angina pectoris: Secondary | ICD-10-CM | POA: Insufficient documentation

## 2021-03-09 DIAGNOSIS — M542 Cervicalgia: Secondary | ICD-10-CM | POA: Insufficient documentation

## 2021-03-09 DIAGNOSIS — Z79899 Other long term (current) drug therapy: Secondary | ICD-10-CM | POA: Diagnosis not present

## 2021-03-09 DIAGNOSIS — M25511 Pain in right shoulder: Secondary | ICD-10-CM | POA: Insufficient documentation

## 2021-03-09 DIAGNOSIS — M545 Low back pain, unspecified: Secondary | ICD-10-CM | POA: Diagnosis not present

## 2021-03-09 MED ORDER — ACETAMINOPHEN 325 MG PO TABS
650.0000 mg | ORAL_TABLET | Freq: Once | ORAL | Status: AC
  Administered 2021-03-09: 13:00:00 650 mg via ORAL
  Filled 2021-03-09: qty 2

## 2021-03-09 NOTE — Discharge Instructions (Addendum)
Your work-up today was reassuring.  Return if things change or worsen.
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
## 2021-03-09 NOTE — ED Notes (Signed)
Pt presents to nurses desk requesting to be discharged. Advised pt that this nurse could not discharge her until provider has placed her up for discharge. Pt is upset and reports she would like to leave but returned to her room.
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
## 2021-03-31 ENCOUNTER — Ambulatory Visit: Payer: Medicare Other

## 2021-04-14 ENCOUNTER — Other Ambulatory Visit: Payer: Self-pay | Admitting: Gastroenterology

## 2021-04-15 NOTE — Telephone Encounter (Signed)
Sending in 4 month supply of Linzess. She needs to be scheduled for a follow-up for refills.

## 2021-04-16 ENCOUNTER — Encounter: Payer: Self-pay | Admitting: Internal Medicine

## 2021-04-16 NOTE — Telephone Encounter (Signed)
LMOM for pt to call office back 

## 2021-04-27 NOTE — Telephone Encounter (Signed)
Informed pt of recommendations. Pt voiced understanding. 

## 2021-05-17 DIAGNOSIS — F17200 Nicotine dependence, unspecified, uncomplicated: Secondary | ICD-10-CM | POA: Insufficient documentation

## 2021-05-26 ENCOUNTER — Other Ambulatory Visit: Payer: Self-pay

## 2021-05-26 ENCOUNTER — Encounter: Payer: Self-pay | Admitting: Urology

## 2021-05-26 ENCOUNTER — Ambulatory Visit (INDEPENDENT_AMBULATORY_CARE_PROVIDER_SITE_OTHER): Payer: Medicare Other | Admitting: Urology

## 2021-05-26 VITALS — BP 134/84 | HR 77 | Ht 60.0 in | Wt 125.0 lb

## 2021-05-26 DIAGNOSIS — N3941 Urge incontinence: Secondary | ICD-10-CM | POA: Diagnosis not present

## 2021-05-26 LAB — URINALYSIS, DIPSTICK ONLY
Bilirubin, UA: NEGATIVE
Glucose, UA: NEGATIVE
Ketones, UA: NEGATIVE
Nitrite, UA: NEGATIVE
Protein,UA: NEGATIVE
Specific Gravity, UA: 1.015 (ref 1.005–1.030)
Urobilinogen, Ur: 0.2 mg/dL (ref 0.2–1.0)
pH, UA: 7 (ref 5.0–7.5)

## 2021-05-26 LAB — BLADDER SCAN AMB NON-IMAGING: Scan Result: 27

## 2021-05-26 MED ORDER — MIRABEGRON ER 50 MG PO TB24: 50.0000 mg | ORAL_TABLET | Freq: Every day | ORAL | 0 refills | Status: DC

## 2021-05-26 NOTE — Progress Notes (Signed)
? ?Assessment: ?1. Urge incontinence   ? ? ?Plan: ?Diagnosis and management of urge incontinence discussed with the patient.  Options for management including behavioral modification, avoidance of dietary irritants, medical therapy, neuromodulation, and chemodenervation discussed. ?Trial of Myrbetriq 25 mg daily x1 week, then increase to 50 mg daily.  Samples given.  Use and side effects discussed. ?Bladder diet sheet given. ?Return to office in 1 month. ? ? ?Chief Complaint:  ?Chief Complaint  ?Patient presents with  ? Urinary Incontinence  ? ? ?History of Present Illness: ? ?[name] is a 66 y.o. year old female who is seen in consultation from Jani Gravel, MD for evaluation of urinary incontinence.  She reports symptoms of urinary incontinence for several years.  She has urinary frequency, voiding approximately 20 times per day.  She has urgency and nocturia x3.  She has urge incontinence requiring pad use.  She is currently using 5-6 maxipads per day.  She does have incontinence at night.  She voids with a good stream and feels like she empties her bladder completely.  No dysuria or gross hematuria.  No recent UTIs.  She previously tried a medication for her bladder which she believes was Detrol.  She reports that the medication helped but she discontinued the medication due to the cost.  She has not been on any medical therapy recently.  She does have problems with constipation and is currently on Linzess. ? ? ?Past Medical History:  ?Past Medical History:  ?Diagnosis Date  ? Coronary artery disease   ? Depression   ? History of syphilis   ? Hypertension   ? Seizures ([place])   ? Thyroid disease   ? ? ?Past Surgical History:  ?Past Surgical History:  ?Procedure Laterality Date  ? APPENDECTOMY    ? TUBAL LIGATION    ? ? ?Allergies:  ?Allergies  ?Allergen Reactions  ? Ambien [Zolpidem Tartrate]   ? Bee Venom Nausea And Vomiting and Swelling  ? Pamelor [Nortriptyline Hcl]   ?  suicidal  ? Seroquel [Quetiapine  Fumarate] Swelling  ? Trazodone And Nefazodone   ? Buspar [Buspirone] Palpitations  ? Wellbutrin [Bupropion] Palpitations  ? Zoloft [Sertraline Hcl] Palpitations  ? ? ?Family History:  ?Family History  ?Problem Relation Age of Onset  ? Hypertension Father   ? Diabetes Mother   ? Asthma Son   ? Depression Maternal Uncle   ? ? ?Social History:  ?Social History  ? ?Tobacco Use  ? Smoking status: Every Day  ?  Packs/day: 1.00  ?  Types: Cigarettes  ? Smokeless tobacco: Never  ?Vaping Use  ? Vaping Use: Never used  ?Substance Use Topics  ? Alcohol use: No  ? Drug use: No  ? ? ?Review of symptoms:  ?Constitutional:  Negative for unexplained weight loss, night sweats, fever, chills ?ENT:  Negative for nose bleeds, sinus pain, painful swallowing ?CV:  Negative for chest pain, shortness of breath, exercise intolerance, palpitations, loss of consciousness ?Resp:  Negative for cough, wheezing, shortness of breath ?GI:  Negative for nausea, vomiting, diarrhea, bloody stools ?GU:  Positives noted in HPI; otherwise negative for gross hematuria, dysuria ?Neuro:  Negative for seizures, poor balance, limb weakness, slurred speech ?Psych:  Negative for lack of energy, depression, anxiety ?Endocrine:  Negative for polydipsia, polyuria, symptoms of hypoglycemia (dizziness, hunger, sweating) ?Hematologic:  Negative for anemia, purpura, petechia, prolonged or excessive bleeding, use of anticoagulants  ?Allergic:  Negative for difficulty breathing or choking as a result of exposure  to anything; no shellfish allergy; no allergic response (rash/itch) to materials, foods ? ?Physical exam: ?BP 134/84   Pulse 77   Ht 5' (1.524 m)   Wt 125 lb (56.7 kg)   BMI 24.41 kg/m?  ?GENERAL APPEARANCE:  Well appearing, well developed, well nourished, NAD ?HEENT: Atraumatic, Normocephalic, oropharynx clear. ?NECK: Supple without lymphadenopathy or thyromegaly. ?LUNGS: Clear to auscultation bilaterally. ?HEART: Regular Rate and Rhythm without murmurs,  gallops, or rubs. ?ABDOMEN: Soft, non-tender, No Masses. ?EXTREMITIES: Moves all extremities well.  Without clubbing, cyanosis, or edema. ?NEUROLOGIC:  Alert and oriented x 3, normal gait, CN II-XII grossly intact.  ?MENTAL STATUS:  Appropriate. ?BACK:  Non-tender to palpation.  No CVAT ?SKIN:  Warm, dry and intact.   ? ?Results: ?U/A dipstick:  trace blood, 1+ LE ? ?Results for orders placed or performed in visit on 05/26/21 (from the past 24 hour(s))  ?BLADDER SCAN AMB NON-IMAGING  ? Collection Time: 05/26/21  2:29 PM  ?Result Value Ref Range  ? Scan Result 27   ? ? ?

## 2021-05-27 ENCOUNTER — Telehealth: Payer: Self-pay | Admitting: *Deleted

## 2021-05-27 ENCOUNTER — Telehealth: Payer: Self-pay | Admitting: Internal Medicine

## 2021-05-27 MED ORDER — LINACLOTIDE 72 MCG PO CAPS: 72.0000 ug | ORAL_CAPSULE | Freq: Every day | ORAL | 11 refills | Status: DC

## 2021-05-27 NOTE — Telephone Encounter (Signed)
Received refill request for Linzess 72 mcg. Last seen in office 01/22/2020 ? ?

## 2021-05-27 NOTE — Telephone Encounter (Signed)
Linzess refilled. Patient needs OV, okay to schedule with APP thanks ?

## 2021-05-28 ENCOUNTER — Encounter: Payer: Self-pay | Admitting: Internal Medicine

## 2021-07-06 ENCOUNTER — Ambulatory Visit: Payer: Medicare Other | Admitting: Urology

## 2021-07-06 NOTE — Progress Notes (Deleted)
Assessment: 1. Urge incontinence     Plan: Diagnosis and management of urge incontinence discussed with the patient.  Options for management including behavioral modification, avoidance of dietary irritants, medical therapy, neuromodulation, and chemodenervation discussed. Trial of Myrbetriq 25 mg daily x1 week, then increase to 50 mg daily.  Samples given.  Use and side effects discussed. Bladder diet sheet given. Return to office in 1 month.   Chief Complaint:  No chief complaint on file.   History of Present Illness:  [name] is a 66 y.o. year old female who is seen for further evaluation of urinary incontinence.  She reports symptoms of urinary incontinence for several years.  She has urinary frequency, voiding approximately 20 times per day.  She has urgency and nocturia x3.  She has urge incontinence requiring pad use.  She is currently using 5-6 maxipads per day.  She does have incontinence at night.  She voids with a good stream and feels like she empties her bladder completely.  No dysuria or gross hematuria.  No recent UTIs.  She previously tried a medication for her bladder which she believes was Detrol.  She reports that the medication helped but she discontinued the medication due to the cost.  She has not been on any medical therapy recently.  She does have problems with constipation and is currently on Linzess. She was given a trial of Myrbetriq 50 mg daily at her visit in March 2023.  Portions of the above documentation were copied from a prior visit for review purposes only.   Past Medical History:  Past Medical History:  Diagnosis Date   Coronary artery disease    Depression    History of syphilis    Hypertension    Seizures ([place])    Thyroid disease     Past Surgical History:  Past Surgical History:  Procedure Laterality Date   APPENDECTOMY     TUBAL LIGATION      Allergies:  Allergies  Allergen Reactions   Ambien [Zolpidem Tartrate]    Bee  Venom Nausea And Vomiting and Swelling   Pamelor [Nortriptyline Hcl]     suicidal   Seroquel [Quetiapine Fumarate] Swelling   Trazodone And Nefazodone    Buspar [Buspirone] Palpitations   Wellbutrin [Bupropion] Palpitations   Zoloft [Sertraline Hcl] Palpitations    Family History:  Family History  Problem Relation Age of Onset   Hypertension Father    Diabetes Mother    Asthma Son    Depression Maternal Uncle     Social History:  Social History   Tobacco Use   Smoking status: Every Day    Packs/day: 1.00    Types: Cigarettes   Smokeless tobacco: Never  Vaping Use   Vaping Use: Never used  Substance Use Topics   Alcohol use: No   Drug use: No    ROS: Constitutional:  Negative for fever, chills, weight loss CV: Negative for chest pain, previous MI, hypertension Respiratory:  Negative for shortness of breath, wheezing, sleep apnea, frequent cough GI:  Negative for nausea, vomiting, bloody stool, GERD  Physical exam: There were no vitals taken for this visit. GENERAL APPEARANCE:  Well appearing, well developed, well nourished, NAD HEENT:  Atraumatic, normocephalic, oropharynx clear NECK:  Supple without lymphadenopathy or thyromegaly ABDOMEN:  Soft, non-tender, no masses EXTREMITIES:  Moves all extremities well, without clubbing, cyanosis, or edema NEUROLOGIC:  Alert and oriented x 3, normal gait, CN II-XII grossly intact MENTAL STATUS:  appropriate BACK:  Non-tender  to palpation, No CVAT SKIN:  Warm, dry, and intact  Results: U/A dipstick:

## 2021-07-08 ENCOUNTER — Other Ambulatory Visit: Payer: Self-pay

## 2021-07-08 ENCOUNTER — Emergency Department (HOSPITAL_COMMUNITY)
Admission: EM | Admit: 2021-07-08 | Discharge: 2021-07-08 | Disposition: A | Discharge status: Home / Self Care | Payer: Medicare Other | Attending: Emergency Medicine | Admitting: Emergency Medicine

## 2021-07-08 ENCOUNTER — Encounter (HOSPITAL_COMMUNITY): Payer: Self-pay | Admitting: *Deleted

## 2021-07-08 DIAGNOSIS — Z79899 Other long term (current) drug therapy: Secondary | ICD-10-CM | POA: Diagnosis not present

## 2021-07-08 DIAGNOSIS — Z7984 Long term (current) use of oral hypoglycemic drugs: Secondary | ICD-10-CM | POA: Diagnosis not present

## 2021-07-08 DIAGNOSIS — R630 Anorexia: Secondary | ICD-10-CM | POA: Diagnosis not present

## 2021-07-08 DIAGNOSIS — R110 Nausea: Secondary | ICD-10-CM | POA: Diagnosis not present

## 2021-07-08 DIAGNOSIS — E876 Hypokalemia: Secondary | ICD-10-CM | POA: Insufficient documentation

## 2021-07-08 DIAGNOSIS — R5383 Other fatigue: Secondary | ICD-10-CM | POA: Insufficient documentation

## 2021-07-08 DIAGNOSIS — R21 Rash and other nonspecific skin eruption: Secondary | ICD-10-CM

## 2021-07-08 HISTORY — DX: Type 2 diabetes mellitus without complications: E11.9

## 2021-07-08 LAB — COMPREHENSIVE METABOLIC PANEL
ALT: 19 U/L (ref 0–44)
AST: 26 U/L (ref 15–41)
Albumin: 4.1 g/dL (ref 3.5–5.0)
Alkaline Phosphatase: 49 U/L (ref 38–126)
Anion gap: 7 (ref 5–15)
BUN: 17 mg/dL (ref 8–23)
CO2: 27 mmol/L (ref 22–32)
Calcium: 9 mg/dL (ref 8.9–10.3)
Chloride: 102 mmol/L (ref 98–111)
Creatinine, Ser: 0.93 mg/dL (ref 0.44–1.00)
GFR, Estimated: >60 mL/min (ref >60)
Glucose, Bld: 103 mg/dL — ABNORMAL HIGH (ref 70–99)
Potassium: 3 mmol/L — ABNORMAL LOW (ref 3.5–5.1)
Sodium: 136 mmol/L (ref 135–145)
Total Bilirubin: 0.4 mg/dL (ref 0.3–1.2)
Total Protein: 7.1 g/dL (ref 6.5–8.1)

## 2021-07-08 LAB — CBC WITH DIFFERENTIAL/PLATELET
Abs Immature Granulocytes: 0.02 10*3/uL (ref 0.00–0.07)
Basophils Absolute: 0 10*3/uL (ref 0.0–0.1)
Basophils Relative: 1 %
Eosinophils Absolute: 0.2 10*3/uL (ref 0.0–0.5)
Eosinophils Relative: 2 %
HCT: 35.4 % — ABNORMAL LOW (ref 36.0–46.0)
Hemoglobin: 12.5 g/dL (ref 12.0–15.0)
Immature Granulocytes: 0 %
Lymphocytes Relative: 29 %
Lymphs Abs: 2 10*3/uL (ref 0.7–4.0)
MCH: 32.8 pg (ref 26.0–34.0)
MCHC: 35.3 g/dL (ref 30.0–36.0)
MCV: 92.9 fL (ref 80.0–100.0)
Monocytes Absolute: 0.5 10*3/uL (ref 0.1–1.0)
Monocytes Relative: 8 %
Neutro Abs: 4.3 10*3/uL (ref 1.7–7.7)
Neutrophils Relative %: 60 %
Platelets: 229 10*3/uL (ref 150–400)
RBC: 3.81 MIL/uL — ABNORMAL LOW (ref 3.87–5.11)
RDW: 12 % (ref 11.5–15.5)
WBC: 7 10*3/uL (ref 4.0–10.5)
nRBC: 0 % (ref 0.0–0.2)

## 2021-07-08 LAB — LACTIC ACID, PLASMA: Lactic Acid, Venous: 0.7 mmol/L (ref 0.5–1.9)

## 2021-07-08 LAB — LIPASE, BLOOD: Lipase: 34 U/L (ref 11–51)

## 2021-07-08 LAB — CBG MONITORING, ED: Glucose-Capillary: 102 mg/dL — ABNORMAL HIGH (ref 70–99)

## 2021-07-08 NOTE — ED Triage Notes (Signed)
Pt states she started a new medication a month ago for her diabetes and states a rash developed a few days ago; pt states the rash is on both her shoulders and states it is itchy ?

## 2021-07-08 NOTE — ED Provider Notes (Signed)
?[facility] ?Provider Note ? ? ?CSN: [id number] ?Arrival date & time: 07/08/21  1515 ? ?  ? ?History ? ?No chief complaint on file. ? ? ?[name] is a 66 y.o. female who presents today for evaluation of concern for medication intolerance/reaction. ?She was started on metformin on 2/22 500 mg twice daily. ?She states that since then she has had worsening fatigue, poor appetite, nausea without vomiting, and generally feeling very unwell.  She reports that she also developed a rash on her bilateral shoulders.  She denies any fevers.  She notes that she has tried treating her rash on her shoulders by mixing bleach with dish soap and rubbing that on the area without any relief.  She denies any fevers.  Her rash has not been spreading.  The rash started within a week of starting the metformin however she notes she also had sun exposure at that time.  She denies any new soaps lotions or laundry detergents. ? ?HPI ? ?  ? ?Home Medications ?Prior to Admission medications   ?Medication Sig Start Date End Date Taking? Authorizing Provider  ?albuterol (VENTOLIN HFA) 108 (90 Base) MCG/ACT inhaler  01/15/20   [provider]  ?atorvastatin (LIPITOR) 80 MG tablet SMARTSIG:1 Tablet(s) By Mouth Every Evening 06/30/19   [provider]  ?Cholecalciferol (VITAMIN D) 50 MCG (2000 UT) CAPS Take by mouth every other day.    [provider]  ?Docusate Calcium (STOOL SOFTENER PO) Take by mouth as needed.    [provider]  ?DULoxetine (CYMBALTA) 30 MG capsule Take 1 capsule (30 mg total) by mouth daily. ?Patient taking differently: Take 30 mg by mouth 2 (two) times daily. 08/11/15   Kerrie Buffalo, NP  ?EUTHYROX 100 MCG tablet Take 100 mcg by mouth every morning. 06/06/20   [provider]  ?famotidine (PEPCID) 20 MG tablet Take 20 mg by mouth 2 (two) times daily. 05/11/20   [provider]  ?linaclotide Rolan Lipa) 72 MCG capsule Take 1 capsule (72 mcg total) by  mouth daily before breakfast. 05/27/21 05/27/22  Eloise Harman, DO  ?lisinopril-hydrochlorothiazide (PRINZIDE,ZESTORETIC) 10-12.5 MG tablet Take 1 tablet by mouth daily. Resume as in outpatient 08/11/15   Kerrie Buffalo, NP  ?loratadine (CLARITIN) 10 MG tablet Take 10 mg by mouth daily as needed.     [provider]  ?metroNIDAZOLE (FLAGYL) 500 MG tablet Take 1 tablet (500 mg total) by mouth 2 (two) times daily. 07/13/20   Estill Dooms, NP  ?mirabegron ER (MYRBETRIQ) 50 MG TB24 tablet Take 1 tablet (50 mg total) by mouth daily. 05/26/21   Stoneking, Reece Leader., MD  ?mirtazapine (REMERON) 15 MG tablet Take 7.5 mg by mouth at bedtime as needed. 07/07/20   [provider]  ?Multiple Vitamins-Minerals (MULTIVITAMIN GUMMIES ADULT PO) Take by mouth daily.    [provider]  ?Omega-3 1000 MG CAPS Take by mouth daily.    [provider]  ?OVER THE COUNTER MEDICATION Immune Support Vitamin C '750mg'$  daily    [provider]  ?potassium chloride (MICRO-K) 10 MEQ CR capsule Take 1 capsule by mouth daily. 01/15/20   [provider]  ?Probiotic Product (PROBIOTIC DAILY PO) Take by mouth daily.    [provider]  ?rosuvastatin (CRESTOR) 10 MG tablet Take 1 tablet by mouth daily. 01/15/20   [provider]  ?   ? ?Allergies    ?Ambien [zolpidem tartrate], Bee venom, Pamelor [nortriptyline hcl], Seroquel [quetiapine fumarate], Trazodone and nefazodone,  Buspar [buspirone], Wellbutrin [bupropion], and Zoloft [sertraline hcl]   ? ?Review of Systems   ?Review of Systems ? ?Physical Exam ?Updated Vital Signs ?BP (!) 143/79   Pulse 71   Temp 98 ?F (36.7 ?C)   Resp 16   Ht 5' (1.524 m)   Wt 54.4 kg   SpO2 100%   BMI 23.44 kg/m?  ?Physical Exam ?Vitals and nursing note reviewed.  ?Constitutional:   ?   General: She is not in acute distress. ?   Appearance: She is not ill-appearing.  ?HENT:  ?   Head: Atraumatic.  ?Eyes:  ?   Conjunctiva/sclera: Conjunctivae  normal.  ?Cardiovascular:  ?   Rate and Rhythm: Normal rate.  ?Pulmonary:  ?   Effort: Pulmonary effort is normal. No respiratory distress.  ?Abdominal:  ?   General: There is no distension.  ?   Tenderness: There is no abdominal tenderness. There is no guarding.  ?Musculoskeletal:  ?   Cervical back: Normal range of motion and neck supple.  ?   Comments: No obvious acute injury  ?Skin: ?   General: Skin is warm.  ?   Comments: Please see clinical image.  Rash is primarily on the right posterior shoulder with secondary excoriation.  Rash is palpable, mildly erythematous.  There is no obvious secondary bacterial infection.  Clinically appears consistent with folliculitis.  ?Neurological:  ?   General: No focal deficit present.  ?   Mental Status: She is alert.  ?   Comments: Awake and alert, answers all questions appropriately.  Speech is not slurred.  ?Psychiatric:     ?   Mood and Affect: Mood normal.     ?   Behavior: Behavior normal.  ? ? ? ? ? ? ? ?ED Results / Procedures / Treatments   ?Labs ?(all labs ordered are listed, but only abnormal results are displayed) ?Labs Reviewed  ?CBC WITH DIFFERENTIAL/PLATELET - Abnormal; Notable for the following components:  ?    Result Value  ? RBC 3.81 (*)   ? HCT 35.4 (*)   ? All other components within normal limits  ?COMPREHENSIVE METABOLIC PANEL - Abnormal; Notable for the following components:  ? Potassium 3.0 (*)   ? Glucose, Bld 103 (*)   ? All other components within normal limits  ?CBG MONITORING, ED - Abnormal; Notable for the following components:  ? Glucose-Capillary 102 (*)   ? All other components within normal limits  ?LIPASE, BLOOD  ?LACTIC ACID, PLASMA  ? ? ?EKG ?None ? ?Radiology ?No results found. ? ?Procedures ?Procedures  ? ? ?Medications Ordered in ED ?Medications - No data to display ? ?ED Course/ Medical Decision Making/ A&P ?  ?                        ?Medical Decision Making ?Patient is a 66 year old woman who presents today for evaluation of  multiple complaints.  Her primary presenting concern is a rash on her shoulders which has been present since she started metformin.  She notes that around that time she also began feeling very poorly. ?With her symptoms starting shortly after she started metformin, combined with her poor appetite and nausea Labs are obtained and reviewed to evaluate for possible metformin related lactic acidosis or other significant derangements. ?Overall her evaluation is reassuring.  She is already on potassium at home.  Recommended she continue to take this. ?Regarding her rash clinically this appears to be mild folliculitis.  Based on her degree of itching however I suspect the primary cause of her symptoms is related to ongoing scratching.  She does not clearly have a bacterial infection. ?I recommended that she try treatment with hydrocortisone cream instead of mixing bleach with dish soap to put on her rash. ? ?Amount and/or Complexity of Data Reviewed ?Labs: ordered. ?   Details: No leukocytosis, no anemia.  Mild hypokalemia.  No lactic acidosis.  Lipase is not elevated.  No hyperglycemia. ? ?Risk ?OTC drugs. ?Decision regarding hospitalization. ? ? ?Return precautions were discussed with patient who states their understanding.  At the time of discharge patient denied any unaddressed complaints or concerns.  Patient is agreeable for discharge home. ? ?Note: Portions of this report may have been transcribed using voice recognition software. Every effort was made to ensure accuracy; however, inadvertent computerized transcription errors may be present ? ? ? ? ? ? ? ? ?Final Clinical Impression(s) / ED Diagnoses ?Final diagnoses:  ?Rash  ?Fatigue, unspecified type  ? ? ?Rx / DC Orders ?ED Discharge Orders   ? ? None  ? ?  ? ? ?  ?Lorin Glass, [place] ?07/08/21 2256 ? ?  ?Dorie Rank, MD ?07/10/21 [phone] ? ?

## 2021-07-08 NOTE — ED Notes (Signed)
Pt given water to take home meds. Verified with ER provider, pt to not take metformin or potassium.  ?

## 2021-07-08 NOTE — Discharge Instructions (Addendum)
Today your blood work was reassuring. ?For the next week I would recommend using hydrocortisone cream.  This is available over-the-counter. ?If you ask the pharmacist they should be able to direct you towards an appropriate hydrocortisone product. ? ?Please try to leave the wound alone is much as possible to let it heal. ?Please keep your follow-up appointment with your primary care doctor. ?If you develop fevers, significant spreading of this, or have any new or concerning symptoms please to seek additional medical care and evaluation. ?

## 2021-07-19 ENCOUNTER — Encounter (HOSPITAL_COMMUNITY): Payer: Self-pay | Admitting: *Deleted

## 2021-07-19 ENCOUNTER — Other Ambulatory Visit: Payer: Self-pay

## 2021-07-19 ENCOUNTER — Emergency Department (HOSPITAL_COMMUNITY)
Admission: EM | Admit: 2021-07-19 | Discharge: 2021-07-21 | Disposition: A | Discharge status: Home / Self Care | Payer: Medicare Other | Source: Home / Self Care | Attending: Emergency Medicine | Admitting: Emergency Medicine

## 2021-07-19 DIAGNOSIS — Z79899 Other long term (current) drug therapy: Secondary | ICD-10-CM | POA: Insufficient documentation

## 2021-07-19 DIAGNOSIS — E119 Type 2 diabetes mellitus without complications: Secondary | ICD-10-CM | POA: Insufficient documentation

## 2021-07-19 DIAGNOSIS — F332 Major depressive disorder, recurrent severe without psychotic features: Secondary | ICD-10-CM | POA: Insufficient documentation

## 2021-07-19 DIAGNOSIS — R45851 Suicidal ideations: Secondary | ICD-10-CM

## 2021-07-19 DIAGNOSIS — F4310 Post-traumatic stress disorder, unspecified: Secondary | ICD-10-CM | POA: Insufficient documentation

## 2021-07-19 DIAGNOSIS — Z20822 Contact with and (suspected) exposure to covid-19: Secondary | ICD-10-CM | POA: Insufficient documentation

## 2021-07-19 LAB — CBC WITH DIFFERENTIAL/PLATELET
Abs Immature Granulocytes: 0.03 10*3/uL (ref 0.00–0.07)
Basophils Absolute: 0.1 10*3/uL (ref 0.0–0.1)
Basophils Relative: 1 %
Eosinophils Absolute: 0.1 10*3/uL (ref 0.0–0.5)
Eosinophils Relative: 1 %
HCT: 39.5 % (ref 36.0–46.0)
Hemoglobin: 13.8 g/dL (ref 12.0–15.0)
Immature Granulocytes: 0 %
Lymphocytes Relative: 18 %
Lymphs Abs: 1.6 10*3/uL (ref 0.7–4.0)
MCH: 32.2 pg (ref 26.0–34.0)
MCHC: 34.9 g/dL (ref 30.0–36.0)
MCV: 92.3 fL (ref 80.0–100.0)
Monocytes Absolute: 0.5 10*3/uL (ref 0.1–1.0)
Monocytes Relative: 5 %
Neutro Abs: 6.8 10*3/uL (ref 1.7–7.7)
Neutrophils Relative %: 75 %
Platelets: 272 10*3/uL (ref 150–400)
RBC: 4.28 MIL/uL (ref 3.87–5.11)
RDW: 11.9 % (ref 11.5–15.5)
WBC: 9.1 10*3/uL (ref 4.0–10.5)
nRBC: 0 % (ref 0.0–0.2)

## 2021-07-19 LAB — COMPREHENSIVE METABOLIC PANEL
ALT: 21 U/L (ref 0–44)
AST: 28 U/L (ref 15–41)
Albumin: 4.7 g/dL (ref 3.5–5.0)
Alkaline Phosphatase: 56 U/L (ref 38–126)
Anion gap: 9 (ref 5–15)
BUN: 13 mg/dL (ref 8–23)
CO2: 27 mmol/L (ref 22–32)
Calcium: 9.5 mg/dL (ref 8.9–10.3)
Chloride: 101 mmol/L (ref 98–111)
Creatinine, Ser: 0.85 mg/dL (ref 0.44–1.00)
GFR, Estimated: >60 mL/min (ref >60)
Glucose, Bld: 118 mg/dL — ABNORMAL HIGH (ref 70–99)
Potassium: 3.2 mmol/L — ABNORMAL LOW (ref 3.5–5.1)
Sodium: 137 mmol/L (ref 135–145)
Total Bilirubin: 0.6 mg/dL (ref 0.3–1.2)
Total Protein: 8 g/dL (ref 6.5–8.1)

## 2021-07-19 LAB — RAPID URINE DRUG SCREEN, HOSP PERFORMED
Amphetamines: NOT DETECTED
Barbiturates: NOT DETECTED
Benzodiazepines: NOT DETECTED
Cocaine: NOT DETECTED
Opiates: NOT DETECTED
Tetrahydrocannabinol: NOT DETECTED

## 2021-07-19 LAB — CBG MONITORING, ED: Glucose-Capillary: 218 mg/dL — ABNORMAL HIGH (ref 70–99)

## 2021-07-19 LAB — ETHANOL: Alcohol, Ethyl (B): <10 mg/dL (ref <10)

## 2021-07-19 MED ORDER — INSULIN ASPART 100 UNIT/ML IJ SOLN
0.0000 [IU] | Freq: Every day | INTRAMUSCULAR | Status: DC
  Administered 2021-07-19: 2 [IU] via SUBCUTANEOUS
  Filled 2021-07-19: qty 1

## 2021-07-19 MED ORDER — LEVOTHYROXINE SODIUM 50 MCG PO TABS: 100.0000 ug | ORAL_TABLET | Freq: Every morning | ORAL | Status: DC

## 2021-07-19 MED ORDER — INSULIN ASPART 100 UNIT/ML IJ SOLN
0.0000 [IU] | Freq: Three times a day (TID) | INTRAMUSCULAR | Status: DC
  Administered 2021-07-20: 1 [IU] via SUBCUTANEOUS
  Administered 2021-07-20: 2 [IU] via SUBCUTANEOUS
  Administered 2021-07-21: 1 [IU] via SUBCUTANEOUS
  Filled 2021-07-19 (×3): qty 1

## 2021-07-19 MED ORDER — ACETAMINOPHEN 325 MG PO TABS
650.0000 mg | ORAL_TABLET | Freq: Once | ORAL | Status: AC
  Administered 2021-07-19: 650 mg via ORAL
  Filled 2021-07-19: qty 2

## 2021-07-19 MED ORDER — LEVOTHYROXINE SODIUM 50 MCG PO TABS
100.0000 ug | ORAL_TABLET | Freq: Every day | ORAL | Status: DC
  Administered 2021-07-20 – 2021-07-21 (×2): 100 ug via ORAL
  Filled 2021-07-19 (×2): qty 2

## 2021-07-19 MED ORDER — ATORVASTATIN CALCIUM 40 MG PO TABS
80.0000 mg | ORAL_TABLET | Freq: Every day | ORAL | Status: DC
  Administered 2021-07-19 – 2021-07-21 (×3): 80 mg via ORAL
  Filled 2021-07-19 (×3): qty 2

## 2021-07-19 MED ORDER — DULOXETINE HCL 30 MG PO CPEP
30.0000 mg | ORAL_CAPSULE | Freq: Two times a day (BID) | ORAL | Status: DC
  Administered 2021-07-19 – 2021-07-21 (×4): 30 mg via ORAL
  Filled 2021-07-19 (×4): qty 1
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
## 2021-07-19 NOTE — ED Notes (Addendum)
This nurse described to the patient the process of evaluation for mental health. Patient is agreeable. Patient belongings locked up and a book kept at bedside. Patient has multicolored top, black pants, white hat. Jewelry placed inside white handbag by the patient. Patient has her reading glasses.  ?

## 2021-07-19 NOTE — ED Notes (Signed)
Pt taken to family room for TTS ?

## 2021-07-19 NOTE — ED Provider Notes (Signed)
?[facility] ?Provider Note ? ? ?CSN: [id number] ?Arrival date & time: 07/19/21  1143 ? ?  ? ?History ? ?Chief Complaint  ?Patient presents with  ? V70.1  ? ? ?[name] is a 66 y.o. female. ? ?Patient has a history of diabetes.  She is then depressed and has been thinking about hurting herself. ? ? ?Altered Mental Status ?Presenting symptoms: behavior changes   ?Severity:  Moderate ?Most recent episode:  More than 2 days ago ?Episode history:  Continuous ?Timing:  Constant ?Progression:  Worsening ?Chronicity:  New ?Context: not alcohol use   ?Associated symptoms: no abdominal pain, no hallucinations, no headaches, no rash and no seizures   ? ?  ? ?Home Medications ?Prior to Admission medications   ?Medication Sig Start Date End Date Taking? Authorizing Provider  ?albuterol (VENTOLIN HFA) 108 (90 Base) MCG/ACT inhaler  01/15/20   [provider]  ?atorvastatin (LIPITOR) 80 MG tablet SMARTSIG:1 Tablet(s) By Mouth Every Evening 06/30/19   [provider]  ?Cholecalciferol (VITAMIN D) 50 MCG (2000 UT) CAPS Take by mouth every other day.    [provider]  ?Docusate Calcium (STOOL SOFTENER PO) Take by mouth as needed.    [provider]  ?DULoxetine (CYMBALTA) 30 MG capsule Take 1 capsule (30 mg total) by mouth daily. ?Patient taking differently: Take 30 mg by mouth 2 (two) times daily. 08/11/15   Kerrie Buffalo, NP  ?EUTHYROX 100 MCG tablet Take 100 mcg by mouth every morning. 06/06/20   [provider]  ?famotidine (PEPCID) 20 MG tablet Take 20 mg by mouth 2 (two) times daily. 05/11/20   [provider]  ?linaclotide Rolan Lipa) 72 MCG capsule Take 1 capsule (72 mcg total) by mouth daily before breakfast. 05/27/21 05/27/22  Eloise Harman, DO  ?lisinopril-hydrochlorothiazide (PRINZIDE,ZESTORETIC) 10-12.5 MG tablet Take 1 tablet by mouth daily. Resume as in outpatient 08/11/15   Kerrie Buffalo, NP  ?loratadine (CLARITIN) 10 MG tablet Take 10 mg by  mouth daily as needed.     [provider]  ?metroNIDAZOLE (FLAGYL) 500 MG tablet Take 1 tablet (500 mg total) by mouth 2 (two) times daily. 07/13/20   Estill Dooms, NP  ?mirabegron ER (MYRBETRIQ) 50 MG TB24 tablet Take 1 tablet (50 mg total) by mouth daily. 05/26/21   Stoneking, Reece Leader., MD  ?mirtazapine (REMERON) 15 MG tablet Take 7.5 mg by mouth at bedtime as needed. 07/07/20   [provider]  ?Multiple Vitamins-Minerals (MULTIVITAMIN GUMMIES ADULT PO) Take by mouth daily.    [provider]  ?Omega-3 1000 MG CAPS Take by mouth daily.    [provider]  ?OVER THE COUNTER MEDICATION Immune Support Vitamin C '750mg'$  daily    [provider]  ?potassium chloride (MICRO-K) 10 MEQ CR capsule Take 1 capsule by mouth daily. 01/15/20   [provider]  ?Probiotic Product (PROBIOTIC DAILY PO) Take by mouth daily.    [provider]  ?rosuvastatin (CRESTOR) 10 MG tablet Take 1 tablet by mouth daily. 01/15/20   [provider]  ?   ? ?Allergies    ?Ambien [zolpidem tartrate], Bee venom, Pamelor [nortriptyline hcl], Seroquel [quetiapine fumarate], Trazodone and nefazodone, Buspar [buspirone], Wellbutrin [bupropion], and Zoloft [sertraline hcl]   ? ?Review of Systems   ?Review of Systems  ?Constitutional:  Negative for appetite change and fatigue.  ?HENT:  Negative for congestion, ear discharge and sinus pressure.   ?Eyes:  Negative for discharge.  ?Respiratory:  Negative for  cough.   ?Cardiovascular:  Negative for chest pain.  ?Gastrointestinal:  Negative for abdominal pain and diarrhea.  ?Genitourinary:  Negative for frequency and hematuria.  ?Musculoskeletal:  Negative for back pain.  ?Skin:  Negative for rash.  ?Neurological:  Negative for seizures and headaches.  ?Psychiatric/Behavioral:  Positive for dysphoric mood. Negative for hallucinations.   ? ?Physical Exam ?Updated Vital Signs ?BP 114/78   Pulse 87   Temp 98.1 ?F (36.7 ?C) (Oral)    Resp 18   Ht 5' (1.524 m)   Wt 54 kg   SpO2 100%   BMI 23.25 kg/m?  ?Physical Exam ?Vitals and nursing note reviewed.  ?Constitutional:   ?   Appearance: She is well-developed.  ?HENT:  ?   Head: Normocephalic.  ?   Mouth/Throat:  ?   Mouth: Mucous membranes are moist.  ?Eyes:  ?   General: No scleral icterus. ?   Conjunctiva/sclera: Conjunctivae normal.  ?Neck:  ?   Thyroid: No thyromegaly.  ?Cardiovascular:  ?   Rate and Rhythm: Normal rate and regular rhythm.  ?   Heart sounds: No murmur heard. ?  No friction rub. No gallop.  ?Pulmonary:  ?   Breath sounds: No stridor. No wheezing or rales.  ?Chest:  ?   Chest Nunn: No tenderness.  ?Abdominal:  ?   General: There is no distension.  ?   Tenderness: There is no abdominal tenderness. There is no rebound.  ?Musculoskeletal:     ?   General: Normal range of motion.  ?   Cervical back: Neck supple.  ?Lymphadenopathy:  ?   Cervical: No cervical adenopathy.  ?Skin: ?   Findings: No erythema or rash.  ?Neurological:  ?   Mental Status: She is alert and oriented to person, place, and time.  ?   Motor: No abnormal muscle tone.  ?   Coordination: Coordination normal.  ?Psychiatric:  ?   Comments: Suicidal  ? ? ?ED Results / Procedures / Treatments   ?Labs ?(all labs ordered are listed, but only abnormal results are displayed) ?Labs Reviewed  ?COMPREHENSIVE METABOLIC PANEL - Abnormal; Notable for the following components:  ?    Result Value  ? Potassium 3.2 (*)   ? Glucose, Bld 118 (*)   ? All other components within normal limits  ?RESP PANEL BY RT-PCR (FLU A&B, COVID) [id number]  ?ETHANOL  ?CBC WITH DIFFERENTIAL/PLATELET  ?RAPID URINE DRUG SCREEN, HOSP PERFORMED  ? ? ?EKG ?None ? ?Radiology ?No results found. ? ?Procedures ?Procedures  ? ? ?Medications Ordered in ED ?Medications - No data to display ? ?ED Course/ Medical Decision Making/ A&P ?  ?                        ?Medical Decision Making ?Amount and/or Complexity of Data Reviewed ?Labs: ordered. ? ?Risk ?OTC  drugs. ? ? ?Patient with suicidal ideations.  She will be evaluated by behavioral health for disposition ? ? ? ? ? ? ? ?Final Clinical Impression(s) / ED Diagnoses ?Final diagnoses:  ?None  ? ? ?Rx / DC Orders ?ED Discharge Orders   ? ? None  ? ?  ? ? ?  ?Milton Ferguson, MD ?07/20/21 1757 ? ?

## 2021-07-19 NOTE — ED Notes (Signed)
Patient wanded via security at this time ?

## 2021-07-19 NOTE — ED Triage Notes (Signed)
Pt states she feels Suicidal starting this past weekend; pt states her family said they would come help her move back home and then they backed out; pt is crying during triage; pt states she had to give her dog up today; pt has no specific plan ?

## 2021-07-20 LAB — CBG MONITORING, ED
Glucose-Capillary: 126 mg/dL — ABNORMAL HIGH (ref 70–99)
Glucose-Capillary: 109 mg/dL — ABNORMAL HIGH (ref 70–99)
Glucose-Capillary: 181 mg/dL — ABNORMAL HIGH (ref 70–99)
Glucose-Capillary: 79 mg/dL (ref 70–99)

## 2021-07-20 LAB — HEMOGLOBIN A1C
Hgb A1c MFr Bld: 5.8 % — ABNORMAL HIGH (ref 4.8–5.6)
Mean Plasma Glucose: 119.76 mg/dL

## 2021-07-20 MED ORDER — NICOTINE 21 MG/24HR TD PT24
21.0000 mg | MEDICATED_PATCH | Freq: Once | TRANSDERMAL | Status: AC
  Administered 2021-07-20: 21 mg via TRANSDERMAL
  Filled 2021-07-20: qty 1

## 2021-07-20 MED ORDER — ACETAMINOPHEN 325 MG PO TABS
650.0000 mg | ORAL_TABLET | Freq: Once | ORAL | Status: AC
  Administered 2021-07-20: 650 mg via ORAL
  Filled 2021-07-20: qty 2

## 2021-07-20 MED ORDER — ACETAMINOPHEN 325 MG PO TABS
650.0000 mg | ORAL_TABLET | Freq: Four times a day (QID) | ORAL | Status: DC | PRN
  Administered 2021-07-20 – 2021-07-21 (×2): 650 mg via ORAL
  Filled 2021-07-20 (×2): qty 2

## 2021-07-20 MED ORDER — HYDROXYZINE HCL 25 MG PO TABS
25.0000 mg | ORAL_TABLET | Freq: Three times a day (TID) | ORAL | Status: DC | PRN
  Administered 2021-07-20: 25 mg via ORAL
  Filled 2021-07-20: qty 1

## 2021-07-20 NOTE — ED Notes (Signed)
Applied warm compress to lumbar area ?

## 2021-07-20 NOTE — Progress Notes (Signed)
CSW requested that [place], RN review pt for [facility] per pt meets inpatient criteria per Lindon Romp, NP. ? ? ?Benjaman Kindler, MSW, LCSWA ?07/20/2021 11:56 PM ? ? ?

## 2021-07-20 NOTE — ED Notes (Signed)
Patient c/o headache, EDP notified  pain scale 7/10 ?

## 2021-07-21 ENCOUNTER — Encounter (HOSPITAL_COMMUNITY): Payer: Self-pay | Admitting: Nurse Practitioner

## 2021-07-21 ENCOUNTER — Inpatient Hospital Stay (HOSPITAL_COMMUNITY)
Admission: AD | Admit: 2021-07-21 | Discharge: 2021-07-23 | DRG: 885 | Disposition: A | Discharge status: Home / Self Care | Payer: Medicare Other | Source: Intra-hospital | Attending: Psychiatry | Admitting: Psychiatry

## 2021-07-21 ENCOUNTER — Other Ambulatory Visit: Payer: Self-pay

## 2021-07-21 DIAGNOSIS — F332 Major depressive disorder, recurrent severe without psychotic features: Secondary | ICD-10-CM | POA: Diagnosis present

## 2021-07-21 DIAGNOSIS — F17210 Nicotine dependence, cigarettes, uncomplicated: Secondary | ICD-10-CM | POA: Diagnosis present

## 2021-07-21 DIAGNOSIS — Z8541 Personal history of malignant neoplasm of cervix uteri: Secondary | ICD-10-CM | POA: Diagnosis not present

## 2021-07-21 DIAGNOSIS — E079 Disorder of thyroid, unspecified: Secondary | ICD-10-CM | POA: Diagnosis present

## 2021-07-21 DIAGNOSIS — Z9151 Personal history of suicidal behavior: Secondary | ICD-10-CM | POA: Diagnosis not present

## 2021-07-21 DIAGNOSIS — R45851 Suicidal ideations: Secondary | ICD-10-CM | POA: Diagnosis present

## 2021-07-21 DIAGNOSIS — I10 Essential (primary) hypertension: Secondary | ICD-10-CM | POA: Diagnosis present

## 2021-07-21 DIAGNOSIS — E119 Type 2 diabetes mellitus without complications: Secondary | ICD-10-CM | POA: Diagnosis present

## 2021-07-21 DIAGNOSIS — Z8249 Family history of ischemic heart disease and other diseases of the circulatory system: Secondary | ICD-10-CM

## 2021-07-21 DIAGNOSIS — G4700 Insomnia, unspecified: Secondary | ICD-10-CM | POA: Diagnosis present

## 2021-07-21 DIAGNOSIS — F603 Borderline personality disorder: Secondary | ICD-10-CM | POA: Diagnosis present

## 2021-07-21 DIAGNOSIS — Z818 Family history of other mental and behavioral disorders: Secondary | ICD-10-CM | POA: Diagnosis not present

## 2021-07-21 DIAGNOSIS — Z20822 Contact with and (suspected) exposure to covid-19: Secondary | ICD-10-CM | POA: Diagnosis present

## 2021-07-21 DIAGNOSIS — F4310 Post-traumatic stress disorder, unspecified: Secondary | ICD-10-CM

## 2021-07-21 DIAGNOSIS — Z79899 Other long term (current) drug therapy: Secondary | ICD-10-CM | POA: Diagnosis not present

## 2021-07-21 LAB — RESP PANEL BY RT-PCR (FLU A&B, COVID) ARPGX2
Influenza A by PCR: NEGATIVE
Influenza B by PCR: NEGATIVE
SARS Coronavirus 2 by RT PCR: NEGATIVE

## 2021-07-21 LAB — CBG MONITORING, ED: Glucose-Capillary: 124 mg/dL — ABNORMAL HIGH (ref 70–99)

## 2021-07-21 MED ORDER — MIRTAZAPINE 7.5 MG PO TABS
7.5000 mg | ORAL_TABLET | Freq: Every day | ORAL | Status: DC
  Administered 2021-07-21 – 2021-07-22 (×2): 7.5 mg via ORAL
  Filled 2021-07-21 (×5): qty 1

## 2021-07-21 MED ORDER — GABAPENTIN 100 MG PO CAPS
100.0000 mg | ORAL_CAPSULE | Freq: Three times a day (TID) | ORAL | Status: DC
  Administered 2021-07-21 – 2021-07-23 (×6): 100 mg via ORAL
  Filled 2021-07-21 (×13): qty 1

## 2021-07-21 MED ORDER — LISINOPRIL 5 MG PO TABS
10.0000 mg | ORAL_TABLET | Freq: Every day | ORAL | Status: DC
  Administered 2021-07-21 – 2021-07-23 (×3): 10 mg via ORAL
  Filled 2021-07-21: qty 1
  Filled 2021-07-21 (×3): qty 2
  Filled 2021-07-21: qty 1
  Filled 2021-07-21: qty 2

## 2021-07-21 MED ORDER — HYDROCHLOROTHIAZIDE 12.5 MG PO TABS
12.5000 mg | ORAL_TABLET | Freq: Every day | ORAL | Status: DC
  Administered 2021-07-21 – 2021-07-23 (×3): 12.5 mg via ORAL
  Filled 2021-07-21 (×6): qty 1

## 2021-07-21 MED ORDER — LEVOTHYROXINE SODIUM 100 MCG PO TABS
100.0000 ug | ORAL_TABLET | Freq: Every day | ORAL | Status: DC
  Administered 2021-07-22: 100 ug via ORAL
  Filled 2021-07-21 (×2): qty 1

## 2021-07-21 MED ORDER — ATORVASTATIN CALCIUM 80 MG PO TABS
80.0000 mg | ORAL_TABLET | Freq: Every day | ORAL | Status: DC
  Administered 2021-07-22 – 2021-07-23 (×2): 80 mg via ORAL
  Filled 2021-07-21 (×5): qty 1

## 2021-07-21 MED ORDER — NICOTINE 14 MG/24HR TD PT24
MEDICATED_PATCH | TRANSDERMAL | Status: AC
  Filled 2021-07-21: qty 1

## 2021-07-21 MED ORDER — HYDROXYZINE HCL 25 MG PO TABS
25.0000 mg | ORAL_TABLET | Freq: Three times a day (TID) | ORAL | Status: DC | PRN
  Administered 2021-07-22: 25 mg via ORAL
  Filled 2021-07-21: qty 1

## 2021-07-21 MED ORDER — ACETAMINOPHEN 325 MG PO TABS: 650.0000 mg | ORAL_TABLET | Freq: Four times a day (QID) | ORAL | Status: DC | PRN

## 2021-07-21 MED ORDER — DULOXETINE HCL 30 MG PO CPEP
30.0000 mg | ORAL_CAPSULE | Freq: Two times a day (BID) | ORAL | Status: DC
  Administered 2021-07-21 – 2021-07-23 (×4): 30 mg via ORAL
  Filled 2021-07-21 (×8): qty 1

## 2021-07-21 MED ORDER — NICOTINE 14 MG/24HR TD PT24
14.0000 mg | MEDICATED_PATCH | Freq: Every day | TRANSDERMAL | Status: DC
  Administered 2021-07-21 – 2021-07-23 (×3): 14 mg via TRANSDERMAL
  Filled 2021-07-21 (×4): qty 1

## 2021-07-21 NOTE — ED Notes (Signed)
Pt signed voluntary consent for treatment, faxed to [facility] ?
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
## 2021-07-21 NOTE — Progress Notes (Signed)
Pt accepted via secure chat with RN.  ?Accepting provider:Jason Gwenlyn Found, NP ?Attending provider: Dr. Caswell Corwin ?Room 405-2 ?Call report to [phone] ? ?

## 2021-07-21 NOTE — ED Notes (Signed)
Called [facility] at this time and was told to call back in 20-30 minutes due to nurses passing meds at this time. ?

## 2021-07-21 NOTE — BHH Group Notes (Signed)
PT attended NA and was attentive and was active by reading the reading aloud. PT also helped [facility] by interpreting for him.  ?
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
## 2021-07-21 NOTE — Progress Notes (Signed)
?  EKG results placed on the outside of pt's shadow chart ? ?Normal sinus rhythm ? ?Normal ECG ? ? ?QT/Qtc-Baz    382/435 ms ?

## 2021-07-21 NOTE — ED Provider Notes (Signed)
TTS consultation is appreciated.  Patient is excepted for inpatient care at [facility]. ?  ?Delora Fuel, MD ?31/12/16 0246 ? ?
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
## 2021-07-21 NOTE — Group Note (Signed)
LCSW Group Therapy Note ? ? ?Group Date: 07/21/2021 ?Start Time: 1300 ?End Time: 1400 ? ? ?Type of Therapy and Topic:  Group Therapy:  Self-Care after Hospitalization ? ?Participation Level:  Active  ? ?Description of Group ?This process group involved patients discussing how they plan to take care of themselves in a better manner when they get home from the hospital.  The group started with patients listing one healthy and one unhealthy way they took care of themselves prior to hospitalization.  A discussion ensued about the differences in healthy and unhealthy coping skills.  Group members shared ideas about making changes when they return home so that they can stay well and in recovery.  The white board was used to list ideas so that patients can continue to see these ideas throughout the day. ? ?Therapeutic Goals ?Patient will identify and describe one healthy and one unhealthy coping technique used prior to hospitalization ?Patient will participate in generating ideas about healthy self-care options when they return to the community ?Patients will be supportive of one another and receive said support from others ?Patient will identify one healthy self-care activity to add to his/her post-hospitalization life that can help in recovery ? ?Summary of Patient Progress:  The patient expressed that prior to hospitalization some healthy self-care activity they engaged in was listening to music.  Patient's participated in group and was appropriate with their peers.   Patient accepted all worksheets and followed along with all group discussions.  ? ? ?Therapeutic Modalities ?Brief Solution-Focused Therapy ?Motivational Interviewing ?Psychoeducation ? ?Darleen Crocker, LCSWA ?07/21/2021  2:07 PM   ? ?

## 2021-07-21 NOTE — ED Notes (Signed)
Called [facility] for report and Lander stated to call back at 8:15 due to nurses meeting.  ?

## 2021-07-21 NOTE — ED Notes (Signed)
Attempted to call report and was told to call back in 20-30 minutes again due to a progression meeting.  ?

## 2021-07-21 NOTE — ED Notes (Signed)
Report called to Rosine Abe at [facility]. Safe transport is on the way for patient.  ?
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
## 2021-07-21 NOTE — Tx Team (Signed)
Initial Treatment Plan ?06/23/4096 ?1:19 PM ?Gifford Shave Mura ?JYN:[id number] ? ? ? ?PATIENT STRESSORS: ?Financial difficulties   ?Health problems   ?Marital or family conflict   ? ? ?PATIENT STRENGTHS: ?Ability for insight  ?Average or above average intelligence  ?Motivation for treatment/growth  ? ? ?PATIENT IDENTIFIED PROBLEMS: ?Suicidal ideation  ?Depression  ?Anxiety  ?Estrangement from daughter  ?  ?  ?  ?  ?  ?  ? ?DISCHARGE CRITERIA:  ?Ability to meet basic life and health needs ?Adequate post-discharge living arrangements ?Improved stabilization in mood, thinking, and/or behavior ?Medical problems require only outpatient monitoring ? ?PRELIMINARY DISCHARGE PLAN: ?Attend 12-step recovery group ?Outpatient therapy ?Return to previous work or school arrangements ? ?PATIENT/FAMILY INVOLVEMENT: ?This treatment plan has been presented to the patient an the pt is given the opportunity to ask questions and make suggestions. ? ?Luna Glasgow, RN ?07/21/2021, 5:59 PM ?

## 2021-07-22 LAB — MAGNESIUM: Magnesium: 2.2 mg/dL (ref 1.7–2.4)

## 2021-07-22 LAB — COMPREHENSIVE METABOLIC PANEL
ALT: 18 U/L (ref 0–44)
AST: 24 U/L (ref 15–41)
Albumin: 4.3 g/dL (ref 3.5–5.0)
Alkaline Phosphatase: 48 U/L (ref 38–126)
Anion gap: 8 (ref 5–15)
BUN: 24 mg/dL — ABNORMAL HIGH (ref 8–23)
CO2: 30 mmol/L (ref 22–32)
Calcium: 9.5 mg/dL (ref 8.9–10.3)
Chloride: 104 mmol/L (ref 98–111)
Creatinine, Ser: 0.93 mg/dL (ref 0.44–1.00)
GFR, Estimated: >60 mL/min (ref >60)
Glucose, Bld: 123 mg/dL — ABNORMAL HIGH (ref 70–99)
Potassium: 3.2 mmol/L — ABNORMAL LOW (ref 3.5–5.1)
Sodium: 142 mmol/L (ref 135–145)
Total Bilirubin: 0.7 mg/dL (ref 0.3–1.2)
Total Protein: 7.3 g/dL (ref 6.5–8.1)

## 2021-07-22 LAB — LIPID PANEL
Cholesterol: 161 mg/dL (ref 0–200)
HDL: 52 mg/dL (ref >40)
LDL Cholesterol: 81 mg/dL (ref 0–99)
Total CHOL/HDL Ratio: 3.1 RATIO
Triglycerides: 142 mg/dL (ref <150)
VLDL: 28 mg/dL (ref 0–40)

## 2021-07-22 LAB — TSH: TSH: 0.238 u[IU]/mL — ABNORMAL LOW (ref 0.350–4.500)

## 2021-07-22 MED ORDER — POTASSIUM CHLORIDE CRYS ER 10 MEQ PO TBCR
10.0000 meq | EXTENDED_RELEASE_TABLET | Freq: Every day | ORAL | Status: DC
  Administered 2021-07-23: 10 meq via ORAL
  Filled 2021-07-22 (×3): qty 1

## 2021-07-22 MED ORDER — POTASSIUM CHLORIDE CRYS ER 20 MEQ PO TBCR
40.0000 meq | EXTENDED_RELEASE_TABLET | Freq: Once | ORAL | Status: AC
  Administered 2021-07-22: 40 meq via ORAL
  Filled 2021-07-22 (×2): qty 2

## 2021-07-22 MED ORDER — LEVOTHYROXINE SODIUM 75 MCG PO TABS
75.0000 ug | ORAL_TABLET | Freq: Every day | ORAL | Status: DC
  Administered 2021-07-23: 75 ug via ORAL
  Filled 2021-07-22 (×3): qty 1

## 2021-07-22 NOTE — Progress Notes (Signed)
Patient appears pleasant and anxious. Patient denies SI/HI/AVH. Patient complied with morning medication with no reported side effects. Patient rates anxiety and depression as a 7.5. Pt reports good sleep and improved appetite. Pt requested to speak to social worker regarding placement after discharge. Pt states that there are "roaches and water bugs in my house and I'm terrified, I can't stay there. I also will need a new mattress due to the bugs". Social work was notified. Patient remains safe on Q34mn checks and contracts for safety.  ? ? ? ? 07/22/21 1000  ?Psych Admission Type (Psych Patients Only)  ?Admission Status Voluntary  ?Psychosocial Assessment  ?Patient Complaints Anxiety;Depression  ?Eye Contact Brief  ?Facial Expression Anxious  ?Affect Anxious  ?Speech Logical/coherent  ?Interaction Assertive  ?Appearance/Hygiene In scrubs  ?Behavior Characteristics Cooperative;Appropriate to situation  ?Mood Anxious;Pleasant  ?Thought Process  ?Coherency WDL  ?Content WDL  ?Delusions None reported or observed  ?Perception WDL  ?Hallucination None reported or observed  ?Judgment Impaired  ?Confusion WDL  ?Danger to Self  ?Current suicidal ideation? Denies  ?Danger to Others  ?Danger to Others None reported or observed  ? ? ?

## 2021-07-22 NOTE — Plan of Care (Signed)
?  Problem: Education: ?Goal: Emotional status will improve ?Outcome: Not Progressing ?Goal: Mental status will improve ?Outcome: Not Progressing ?  ?Problem: Activity: ?Goal: Sleeping patterns will improve ?Outcome: Not Progressing ?  ?

## 2021-07-22 NOTE — BHH Group Notes (Signed)
Patient did not attend the relaxation group. 
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
## 2021-07-22 NOTE — Progress Notes (Addendum)
Pt presents with elevated anxiety.  Pt observed in the milieu talking with peers.  Pt denies SI/HI/AVH, and verbally contracts for safety. ? ?Pt BP 166/88, HR 100.  Administered scheduled dose of Lisinopril. ? ?Pt is safe on the unit with Q 15 min safety checks.  ? ? ? 07/21/21 2142  ?Psych Admission Type (Psych Patients Only)  ?Admission Status Voluntary  ?Psychosocial Assessment  ?Patient Complaints Anxiety  ?Eye Contact Brief  ?Facial Expression Anxious  ?Affect Anxious  ?Speech Logical/coherent  ?Interaction Assertive  ?Motor Activity Fidgety  ?Appearance/Hygiene In scrubs  ?Behavior Characteristics Cooperative  ?Mood Pleasant;Anxious  ?Thought Process  ?Coherency WDL  ?Content Religiosity  ?Delusions None reported or observed  ?Perception WDL  ?Hallucination None reported or observed  ?Judgment Impaired  ?Confusion WDL  ?Danger to Self  ?Current suicidal ideation? Denies  ?Danger to Others  ?Danger to Others None reported or observed  ? ?

## 2021-07-22 NOTE — Progress Notes (Incomplete)
[facility] Group Notes:  (Nursing/MHT/Case Management/Adjunct) ? ?Date:  07/22/2021  ?Time:  2015 ?Type of Therapy:   wrap up group ? ?Participation Level:  Active ? ?Participation Quality:  {BHH PARTICIPATION QUALITY:22265} ? ?Affect:  {BHH AFFECT:22266} ? ?Cognitive:  {BHH COGNITIVE:22267} ? ?Insight:  {BHH Insight2:20797} ? ?Engagement in Group:  {BHH ENGAGEMENT IN OBTVM:99718} ? ?Modes of Intervention:  {BHH MODES OF INTERVENTION:22269} ? ?Summary of Progress/Problems: ? ?[name] ?07/22/2021, 9:52 PM ?

## 2021-07-22 NOTE — Plan of Care (Signed)
?  Problem: Education: ?Goal: Knowledge of [facility] Education information/materials will improve ?Outcome: Progressing ?Goal: Emotional status will improve ?Outcome: Progressing ?  ?Problem: Activity: ?Goal: Interest or engagement in activities will improve ?Outcome: Progressing ?  ?Problem: Coping: ?Goal: Ability to verbalize frustrations and anger appropriately will improve ?Outcome: Progressing ?Goal: Ability to demonstrate self-control will improve ?Outcome: Progressing ?  ?Problem: Health Behavior/Discharge Planning: ?Goal: Identification of resources available to assist in meeting health care needs will improve ?Outcome: Progressing ?  ?Problem: Safety: ?Goal: Periods of time without injury will increase ?Outcome: Progressing ?  ?Problem: Activity: ?Goal: Imbalance in normal sleep/wake cycle will improve ?Outcome: Progressing ?  ?Problem: Coping: ?Goal: Will verbalize feelings ?Outcome: Progressing ?  ?Problem: Safety: ?Goal: Ability to disclose and discuss suicidal ideas will improve ?Outcome: Progressing ?  ?Problem: Coping: ?Goal: Ability to interact with others will improve ?Outcome: Progressing ?Goal: Ability to use eye contact when communicating with others will improve ?Outcome: Progressing ?  ?Problem: Education: ?Goal: Mental status will improve ?Outcome: Not Progressing ?  ?Problem: Self-Concept: ?Goal: Level of anxiety will decrease ?Outcome: Not Progressing ?  ?

## 2021-07-22 NOTE — BHH Counselor (Signed)
CSW provided this patient with resources for housing.  ? ? ? ? ?Darletta Moll MSW, LCSW ?Clincal Social Worker  ?[facility]  ?

## 2021-07-23 ENCOUNTER — Encounter (HOSPITAL_COMMUNITY): Payer: Self-pay

## 2021-07-23 DIAGNOSIS — F603 Borderline personality disorder: Secondary | ICD-10-CM

## 2021-07-23 MED ORDER — LEVOTHYROXINE SODIUM 75 MCG PO TABS: 75.0000 ug | ORAL_TABLET | Freq: Every day | ORAL | 0 refills | Status: AC

## 2021-07-23 MED ORDER — NICOTINE 14 MG/24HR TD PT24: 14.0000 mg | MEDICATED_PATCH | Freq: Every day | TRANSDERMAL | 0 refills | Status: AC

## 2021-07-23 MED ORDER — GABAPENTIN 100 MG PO CAPS: 100.0000 mg | ORAL_CAPSULE | Freq: Three times a day (TID) | ORAL | 0 refills | Status: AC

## 2021-07-23 MED ORDER — MIRTAZAPINE 7.5 MG PO TABS: 7.5000 mg | ORAL_TABLET | Freq: Every day | ORAL | 0 refills | Status: AC

## 2021-07-23 MED ORDER — DULOXETINE HCL 30 MG PO CPEP: 30.0000 mg | ORAL_CAPSULE | Freq: Two times a day (BID) | ORAL | 0 refills | Status: AC

## 2021-07-23 MED ORDER — HYDROXYZINE HCL 25 MG PO TABS: 25.0000 mg | ORAL_TABLET | Freq: Three times a day (TID) | ORAL | 0 refills | Status: AC | PRN

## 2021-07-23 NOTE — Group Note (Signed)
Recreation Therapy Group Note ? ? ?Group Topic:Stress Management  ?Group Date: 07/23/2021 ?Start Time: [phone] ?End Time: 7011 ?Facilitators: Victorino Sparrow, LRT,CTRS ?Location: [facility] ? ? ?Goal Area(s) Addresses:  ?Patient will identify positive stress management techniques. ?Patient will identify benefits of using stress management post d/c. ? ?Group Description:  Meditation.  LRT played a meditation that focused on visualizing your future self and the life you wish to live.  The meditation also had listeners identify the inner roadblocks that were holding them back from progressing in life.  Patients were to learn to unlock the roadblocks, recognize them and learn to overcome them to be the best versions of themselves. ? ? ?Affect/Mood: N/A ?  ?Participation Level: Did not attend ?  ? ?Clinical Observations/Individualized Feedback:   ? ? ?Plan: Continue to engage patient in RT group sessions 2-3x/week. ? ? ?Victorino Sparrow, LRT,CTRS ?07/23/2021 1:10 PM ?
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
## 2021-07-23 NOTE — Discharge Instructions (Signed)
-  Follow-up with your outpatient psychiatric provider -instructions on appointment date, time, and address (location) are provided to you in discharge paperwork. ? ?-Take your psychiatric medications as prescribed at discharge - instructions are provided to you in the discharge paperwork ? ?-Follow-up with outpatient primary care doctor and other specialists -for management of chronic medical disease, including: thyroid disease and low potassium. As we discussed, please call the PCP listed on your discharge papers, to have follow-up labs for low potassium within 1 week. Continue home potassium supplementation regimen.  ? ?-Testing: Follow-up with outpatient provider for abnormal lab results:  ?07-22-2021: ?TSH 0.238 ?Potassium: 3.2 ? ?07-19-2021: ?HbA1c: 5.8 ? ?-Recommend abstinence from alcohol, tobacco, and other illicit drug use at discharge.  ? ?-If your psychiatric symptoms recur, worsen, or if you have side effects to your psychiatric medications, call your outpatient psychiatric provider, 911, 988 or go to the nearest emergency department. ? ?-If suicidal thoughts recur, call your outpatient psychiatric provider, 911, 988 or go to the nearest emergency department. ?

## 2021-07-23 NOTE — [facility] Suicide Risk Assessment (Signed)
[facility] INPATIENT:  Family/Significant Other Suicide Prevention Education ? ?Suicide Prevention Education:  ?Contact Attempts: daughter-in-law Philomena Course  [phone]), has been identified by the patient as the family member/significant other with whom the patient will be residing, and identified as the person(s) who will aid the patient in the event of a mental health crisis.  With written consent from the patient, two attempts were made to provide suicide prevention education, prior to and/or following the patient's discharge.  We were unsuccessful in providing suicide prevention education.  A suicide education pamphlet was given to the patient to share with family/significant other. ? ?Date and time of first attempt: 07/22/2021 / 3:30pm CSW unable to leave voicemail.  ?Date and time of second attempt:07/23/2021 / 9:18 CSW attempted to reach support. Unable to leave voicemail.  ? ?  ?Unable to reach supports, SPE pamphlet placed on chart for patient to share with supports at discharge.  ? ? ?[place] ?07/23/2021, 9:17 AM ?

## 2021-07-23 NOTE — Progress Notes (Signed)
?   07/23/21 0900  ?Psych Admission Type (Psych Patients Only)  ?Admission Status Voluntary  ?Psychosocial Assessment  ?Patient Complaints Anxiety  ?Eye Contact Brief  ?Facial Expression Anxious  ?Affect Anxious  ?Speech Logical/coherent  ?Interaction Assertive  ?Motor Activity Fidgety  ?Appearance/Hygiene Unremarkable  ?Behavior Characteristics Cooperative  ?Mood Anxious;Pleasant  ?Thought Process  ?Coherency WDL  ?Content WDL  ?Delusions None reported or observed  ?Perception WDL  ?Hallucination None reported or observed  ?Judgment Impaired  ?Confusion WDL  ?Danger to Self  ?Current suicidal ideation? Denies  ?Danger to Others  ?Danger to Others None reported or observed  ? ? ?
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
## 2021-07-23 NOTE — BH IP Treatment Plan (Signed)
Interdisciplinary Treatment and Diagnostic Plan Update ? ?07/23/2021 ?Time of Session: 71:24PY ?[name] ?MRN: [id number] ? ?Principal Diagnosis: Severe recurrent major depression without psychotic features ([place]) ? ?Secondary Diagnoses: Principal Problem: ?  Severe recurrent major depression without psychotic features ([place]) ?Active Problems: ?  Borderline personality disorder ([place]) ?  PTSD (post-traumatic stress disorder) ? ? ?Current Medications:  ?Current Facility-Administered Medications  ?Medication Dose Route Frequency Provider Last Rate Last Admin  ? acetaminophen (TYLENOL) tablet 650 mg  650 mg Oral Q6H PRN Lindon Romp A, NP      ? atorvastatin (LIPITOR) tablet 80 mg  80 mg Oral Daily Lindon Romp A, NP   80 mg at 07/23/21 0539  ? DULoxetine (CYMBALTA) DR capsule 30 mg  30 mg Oral BID Lindon Romp A, NP   30 mg at 07/23/21 7673  ? gabapentin (NEURONTIN) capsule 100 mg  100 mg Oral TID Janine Limbo, MD   100 mg at 07/23/21 1122  ? hydrochlorothiazide (HYDRODIURIL) tablet 12.5 mg  12.5 mg Oral Daily Massengill, Ovid Curd, MD   12.5 mg at 07/23/21 0815  ? hydrOXYzine (ATARAX) tablet 25 mg  25 mg Oral TID PRN Rozetta Nunnery, NP   25 mg at 07/22/21 2057  ? levothyroxine (SYNTHROID) tablet 75 mcg  75 mcg Oral Q0600 Janine Limbo, MD   75 mcg at 07/23/21 4193  ? lisinopril (ZESTRIL) tablet 10 mg  10 mg Oral Daily Massengill, Nathan, MD   10 mg at 07/23/21 0815  ? mirtazapine (REMERON) tablet 7.5 mg  7.5 mg Oral QHS Massengill, Nathan, MD   7.5 mg at 07/22/21 2057  ? nicotine (NICODERM CQ - dosed in mg/24 hours) patch 14 mg  14 mg Transdermal Daily Nicholes Rough, NP   14 mg at 07/23/21 7902  ? potassium chloride (KLOR-CON M) CR tablet 10 mEq  10 mEq Oral Daily Massengill, Ovid Curd, MD   10 mEq at 07/23/21 0815  ? ?PTA Medications: ?Medications Prior to Admission  ?Medication Sig Dispense Refill Last Dose  ? albuterol (VENTOLIN HFA) 108 (90 Base) MCG/ACT inhaler      ? atorvastatin (LIPITOR) 80 MG tablet  Take 80 mg by mouth daily.     ? Docusate Calcium (STOOL SOFTENER PO) Take by mouth as needed. (Patient not taking: Reported on 07/19/2021)     ? EUTHYROX 100 MCG tablet Take 100 mcg by mouth every morning.     ? linaclotide (LINZESS) 72 MCG capsule Take 1 capsule (72 mcg total) by mouth daily before breakfast. 30 capsule 11   ? lisinopril-hydrochlorothiazide (PRINZIDE,ZESTORETIC) 10-12.5 MG tablet Take 1 tablet by mouth daily. Resume as in outpatient 30 tablet 0   ? metFORMIN (GLUCOPHAGE) 500 MG tablet Take 500 mg by mouth 2 (two) times daily.     ? mirabegron ER (MYRBETRIQ) 50 MG TB24 tablet Take 1 tablet (50 mg total) by mouth daily. (Patient not taking: Reported on 07/19/2021) 28 tablet 0   ? Multiple Vitamins-Minerals (MULTIVITAMIN GUMMIES ADULT PO) Take by mouth daily.     ? OVER THE COUNTER MEDICATION Immune Support Vitamin C '750mg'$  daily     ? potassium chloride (MICRO-K) 10 MEQ CR capsule Take 1 capsule by mouth daily.     ? Probiotic Product (PROBIOTIC DAILY PO) Take by mouth daily.     ? rosuvastatin (CRESTOR) 10 MG tablet Take 10 mg by mouth daily.     ? [DISCONTINUED] DULoxetine (CYMBALTA) 30 MG capsule Take 1 capsule (30 mg total) by mouth daily. (Patient taking  differently: Take 30 mg by mouth 2 (two) times daily.) 30 capsule 0   ? ? ?Patient Stressors: Financial difficulties   ?Health problems   ?Marital or family conflict   ? ?Patient Strengths: Ability for insight  ?Average or above average intelligence  ?Motivation for treatment/growth  ? ?Treatment Modalities: Medication Management, Group therapy, Case management,  ?1 to 1 session with clinician, Psychoeducation, Recreational therapy. ? ? ?Physician Treatment Plan for Primary Diagnosis: Severe recurrent major depression without psychotic features ([place]) ?Long Term Goal(s): Improvement in symptoms so as ready for discharge  ? ?Short Term Goals: Ability to identify changes in lifestyle to reduce recurrence of condition will improve ?Ability to verbalize  feelings will improve ?Ability to disclose and discuss suicidal ideas ?Ability to demonstrate self-control will improve ?Ability to identify and develop effective coping behaviors will improve ?Ability to maintain clinical measurements within normal limits will improve ?Compliance with prescribed medications will improve ?Ability to identify triggers associated with substance abuse/mental health issues will improve ? ?Medication Management: Evaluate patient's response, side effects, and tolerance of medication regimen. ? ?Therapeutic Interventions: 1 to 1 sessions, Unit Group sessions and Medication administration. ? ?Evaluation of Outcomes: Adequate for Discharge ? ?Physician Treatment Plan for Secondary Diagnosis: Principal Problem: ?  Severe recurrent major depression without psychotic features ([place]) ?Active Problems: ?  Borderline personality disorder ([place]) ?  PTSD (post-traumatic stress disorder) ? ?Long Term Goal(s): Improvement in symptoms so as ready for discharge  ? ?Short Term Goals: Ability to identify changes in lifestyle to reduce recurrence of condition will improve ?Ability to verbalize feelings will improve ?Ability to disclose and discuss suicidal ideas ?Ability to demonstrate self-control will improve ?Ability to identify and develop effective coping behaviors will improve ?Ability to maintain clinical measurements within normal limits will improve ?Compliance with prescribed medications will improve ?Ability to identify triggers associated with substance abuse/mental health issues will improve    ? ?Medication Management: Evaluate patient's response, side effects, and tolerance of medication regimen. ? ?Therapeutic Interventions: 1 to 1 sessions, Unit Group sessions and Medication administration. ? ?Evaluation of Outcomes: Adequate for Discharge ? ? ?RN Treatment Plan for Primary Diagnosis: Severe recurrent major depression without psychotic features ([place]) ?Long Term Goal(s): Knowledge of disease  and therapeutic regimen to maintain health will improve ? ?Short Term Goals: Ability to remain free from injury will improve, Ability to verbalize frustration and anger appropriately will improve, Ability to demonstrate self-control, Ability to participate in decision making will improve, Ability to verbalize feelings will improve, Ability to disclose and discuss suicidal ideas, Ability to identify and develop effective coping behaviors will improve, and Compliance with prescribed medications will improve ? ?Medication Management: RN will administer medications as ordered by provider, will assess and evaluate patient's response and provide education to patient for prescribed medication. RN will report any adverse and/or side effects to prescribing provider. ? ?Therapeutic Interventions: 1 on 1 counseling sessions, Psychoeducation, Medication administration, Evaluate responses to treatment, Monitor vital signs and CBGs as ordered, Perform/monitor CIWA, COWS, AIMS and Fall Risk screenings as ordered, Perform wound care treatments as ordered. ? ?Evaluation of Outcomes: Adequate for Discharge ? ? ?LCSW Treatment Plan for Primary Diagnosis: Severe recurrent major depression without psychotic features ([place]) ?Long Term Goal(s): Safe transition to appropriate next level of care at discharge, Engage patient in therapeutic group addressing interpersonal concerns. ? ?Short Term Goals: Engage patient in aftercare planning with referrals and resources, Increase social support, Increase ability to appropriately verbalize feelings, Increase emotional regulation, Facilitate acceptance  of mental health diagnosis and concerns, Facilitate patient progression through stages of change regarding substance use diagnoses and concerns, Identify triggers associated with mental health/substance abuse issues, and Increase skills for wellness and recovery ? ?Therapeutic Interventions: Assess for all discharge needs, 1 to 1 time with Research officer, political party, Explore available resources and support systems, Assess for adequacy in community support network, Educate family and significant other(s) on suicide prevention, Complete Psychosocial Assessment, Int

## 2021-07-23 NOTE — Plan of Care (Addendum)
Discharge note: Patient discharged home per MD order. Patient received all personal belongings from unit and lockers. She denies any thoughts of self harm. Patient will left ambulatory at 1300 with a cab voucher. ?

## 2021-07-23 NOTE — Progress Notes (Signed)
?  [facility] Adult Case Management Discharge Plan : ? ?Will you be returning to the same living situation after discharge:  Yes,  to home ?At discharge, do you have transportation home?: No. Taxi will be arranged ?Do you have the ability to pay for your medications: Yes,  has insurance ? ?Release of information consent forms completed and in the chart;  Patient's signature needed at discharge. ? ?Patient to Follow up at: ? Follow-up Information   ? ? Services, [facility]. Go on 08/13/2021.   ?Why: You have a hospital follow up appointment for interim therapy and medication management services on 08/13/21 at 12:30 pm  This appointment will be held in person.  If you need to be seen sooner for therapy services on M,W, or F and ask for Jana Half.  *Please ask this provider to forward your notes on to [place]. ?Contact information: ?[address] ?[place] [address] ?[phone] ? ? ?  ?  ? ? [place] on 08/19/2021.   ?Why: You have an appointment for medication management services on 08/19/21 at 10:00 am.  This appointment will be held in person.  You have an appointment on 09/27/21 at 12:30 pm for therapy services (in person). ?Contact information: ?[address] ?[place] [address] ?[phone] ? ? ?  ?  ? ?  ?  ? ?  ? ? ?Next level of care provider has access to [place] ? ?Safety Planning and Suicide Prevention discussed: Yes,  with patient ? ?  ? ?Has patient been referred to the Quitline?: Yes, faxed on 07/23/2021 ? ?Patient has been referred for addiction treatment: N/A ? ?Vassie Moselle, LCSW ?07/23/2021, 9:45 AM ?

## 2021-07-23 NOTE — Progress Notes (Signed)
?   07/23/21 0500  ?Psych Admission Type (Psych Patients Only)  ?Admission Status Voluntary  ?Psychosocial Assessment  ?Patient Complaints Anxiety;Depression  ?Eye Contact Brief  ?Facial Expression Anxious  ?Affect Anxious  ?Speech Logical/coherent  ?Interaction Assertive  ?Appearance/Hygiene In scrubs  ?Behavior Characteristics Cooperative;Appropriate to situation  ?Mood Anxious;Pleasant  ?Thought Process  ?Coherency WDL  ?Content WDL  ?Delusions None reported or observed  ?Perception WDL  ?Hallucination None reported or observed  ?Judgment Impaired  ?Confusion WDL  ?Danger to Self  ?Current suicidal ideation? Denies  ?Danger to Others  ?Danger to Others None reported or observed  ? ? ?

## 2021-07-23 NOTE — Care Management Important Message (Signed)
Medicare IM printed for social work to give to the patient.  ?

## 2021-07-23 NOTE — BHH Group Notes (Signed)
Adult Psychoeducational Group Note ? ?Date:  07/23/2021 ?Time:  10:34 AM ? ?Group Topic/Focus:  ?Orientation:   The focus of this group is to educate the patient on the purpose and policies of crisis stabilization and provide a format to answer questions about their admission.  The group details unit policies and expectations of patients while admitted. ? ?Participation Level:  Active ? ?Participation Quality:  Appropriate ? ?Affect:  Appropriate ? ?Cognitive:  Appropriate ? ?Insight: Appropriate ? ?Engagement in Group:  Engaged ? ?Modes of Intervention:  Education ? ? ?Frutoso Chase ?07/23/2021, 10:34 AM ?

## 2021-10-19 ENCOUNTER — Encounter: Payer: Self-pay | Admitting: *Deleted

## 2021-12-22 ENCOUNTER — Other Ambulatory Visit (INDEPENDENT_AMBULATORY_CARE_PROVIDER_SITE_OTHER): Payer: Medicare Other | Admitting: *Deleted

## 2021-12-22 DIAGNOSIS — R350 Frequency of micturition: Secondary | ICD-10-CM | POA: Diagnosis not present

## 2021-12-22 DIAGNOSIS — R319 Hematuria, unspecified: Secondary | ICD-10-CM

## 2021-12-22 LAB — POCT URINALYSIS DIPSTICK
Glucose, UA: NEGATIVE
Ketones, UA: NEGATIVE
Leukocytes, UA: NEGATIVE
Nitrite, UA: NEGATIVE
Protein, UA: NEGATIVE

## 2021-12-22 NOTE — Progress Notes (Signed)
   NURSE VISIT- UTI SYMPTOMS   SUBJECTIVE:  [name] is a 66 y.o. [phone] female here for UTI symptoms. She is a GYN patient. She reports urinary frequency.  OBJECTIVE:  There were no vitals taken for this visit.  Appears well, in no apparent distress  Results for orders placed or performed in visit on 12/22/21 (from the past 24 hour(s))  POCT Urinalysis Dipstick   Collection Time: 12/22/21  2:55 PM  Result Value Ref Range   Color, UA     Clarity, UA     Glucose, UA Negative Negative   Bilirubin, UA     Ketones, UA neg    Spec Grav, UA     Blood, UA trace    pH, UA     Protein, UA Negative Negative   Urobilinogen, UA     Nitrite, UA neg    Leukocytes, UA Negative Negative   Appearance     Odor      ASSESSMENT: GYN patient with UTI symptoms and negative nitrites  PLAN: Note routed to [name], AGNP   Rx sent by provider today: No Urine culture sent Call or return to clinic prn if these symptoms worsen or fail to improve as anticipated. Follow-up: as needed. Pt would like a call with results.    [name]  12/22/2021 3:00 PM

## 2021-12-23 LAB — URINALYSIS, ROUTINE W REFLEX MICROSCOPIC
Bilirubin, UA: NEGATIVE
Glucose, UA: NEGATIVE
Ketones, UA: NEGATIVE
Leukocytes,UA: NEGATIVE
Nitrite, UA: NEGATIVE
Protein,UA: NEGATIVE
RBC, UA: NEGATIVE
Specific Gravity, UA: 1.008 (ref 1.005–1.030)
Urobilinogen, Ur: 0.2 mg/dL (ref 0.2–1.0)
pH, UA: 7 (ref 5.0–7.5)

## 2021-12-24 LAB — URINE CULTURE: Organism ID, Bacteria: NO GROWTH

## 2021-12-27 ENCOUNTER — Telehealth: Payer: Self-pay | Admitting: *Deleted

## 2021-12-27 NOTE — Telephone Encounter (Signed)
-----   Message from Estill Dooms, NP sent at 12/24/2021  2:27 PM EDT ----- Let her know no growth on urine

## 2021-12-27 NOTE — Telephone Encounter (Signed)
Pt aware no growth on urine culture. If pt notices any more blood, let us know. Pt voiced understanding. [place]

## 2021-12-27 NOTE — Telephone Encounter (Signed)
Left message @ 12:10 pm. JSY 

## 2022-01-18 ENCOUNTER — Encounter: Payer: Self-pay | Admitting: *Deleted

## 2022-02-17 ENCOUNTER — Ambulatory Visit: Payer: Medicare Other | Admitting: Internal Medicine

## 2022-02-17 ENCOUNTER — Encounter: Payer: Self-pay | Admitting: Internal Medicine

## 2022-02-17 VITALS — BP 137/72 | HR 78 | Temp 98.6°F | Ht 60.0 in | Wt 122.0 lb

## 2022-02-17 DIAGNOSIS — K219 Gastro-esophageal reflux disease without esophagitis: Secondary | ICD-10-CM | POA: Diagnosis not present

## 2022-02-17 DIAGNOSIS — R195 Other fecal abnormalities: Secondary | ICD-10-CM

## 2022-02-17 DIAGNOSIS — K5900 Constipation, unspecified: Secondary | ICD-10-CM | POA: Diagnosis not present

## 2022-02-17 MED ORDER — FAMOTIDINE 20 MG PO TABS: 20.0000 mg | ORAL_TABLET | Freq: Three times a day (TID) | ORAL | 3 refills | Status: DC

## 2022-02-17 NOTE — Progress Notes (Signed)
Primary Care Physician:  [facility] Primary Gastroenterologist:  Dr. Abbey Chatters  Chief Complaint  Patient presents with   Follow-up    Positive cologuard/ pos colonoscopy    HPI:   [name] is a 66 y.o. female who presents to the clinic today for follow up visit..  She has multiple GI complaints for me today.  She states she was diagnosed with a peptic ulcer nearly 10 years ago by EGD.  States this flares up rather frequently.  Previously taking famotidine 20 mg 3 times daily. Has been stopped not sure why, she wishes to go back on it.  Does have breakthrough symptoms.  Denies any dysphagia or odynophagia.  No melena or hematochezia.  No chronic NSAID use.    Also with chronic constipation, previously on Linzess, no longer taking this. She states her bowels are moving well with diet modification.   Recent positive Cologuard. No prior colonoscopy. Was scheduled for one in 2021 but cancelled.   Past Medical History:  Diagnosis Date   Coronary artery disease    Depression    Diabetes mellitus without complication ([place])    History of syphilis    Hypertension    Seizures ([place])    Thyroid disease     Past Surgical History:  Procedure Laterality Date   APPENDECTOMY     TUBAL LIGATION      Current Outpatient Medications  Medication Sig Dispense Refill   ACCU-CHEK GUIDE test strip USE STRIP TO CHECK GLUCOSE ONCE DAILY     Accu-Chek Softclix Lancets lancets daily.     albuterol (VENTOLIN HFA) 108 (90 Base) MCG/ACT inhaler      Blood Glucose Monitoring Suppl (ACCU-CHEK GUIDE) w/Device KIT daily. as directed     gabapentin (NEURONTIN) 100 MG capsule Take 100 mg by mouth 3 (three) times daily.     lisinopril-hydrochlorothiazide (PRINZIDE,ZESTORETIC) 10-12.5 MG tablet Take 1 tablet by mouth daily. Resume as in outpatient 30 tablet 0   potassium chloride (MICRO-K) 10 MEQ CR capsule Take 1 capsule by mouth daily.     rosuvastatin (CRESTOR) 20 MG tablet Take 20 mg by  mouth daily.     DULoxetine (CYMBALTA) 30 MG capsule Take 1 capsule (30 mg total) by mouth 2 (two) times daily. 60 capsule 0   gabapentin (NEURONTIN) 100 MG capsule Take 1 capsule (100 mg total) by mouth 3 (three) times daily. 90 capsule 0   hydrOXYzine (VISTARIL) 25 MG capsule Take 25 mg by mouth 3 (three) times daily. (Patient not taking: Reported on 01/18/2022)     levothyroxine (SYNTHROID) 75 MCG tablet Take 1 tablet (75 mcg total) by mouth daily at 6 (six) AM. 30 tablet 0   mirtazapine (REMERON) 7.5 MG tablet Take 1 tablet (7.5 mg total) by mouth at bedtime. 30 tablet 0   No current facility-administered medications for this visit.    Allergies as of 02/17/2022 - Review Complete 02/17/2022  Allergen Reaction Noted   Ambien [zolpidem tartrate]  08/09/2015   Bee venom Nausea And Vomiting and Swelling 05/20/2014   Pamelor [nortriptyline hcl]  08/09/2015   Seroquel [quetiapine fumarate] Swelling 11/28/2014   Trazodone and nefazodone  08/09/2015   Buspar [buspirone] Palpitations 11/12/2012   Wellbutrin [bupropion] Palpitations 11/12/2012   Zoloft [sertraline hcl] Palpitations 11/12/2012    Family History  Problem Relation Age of Onset   Hypertension Father    Diabetes Mother    Asthma Son    Depression Maternal Uncle     Social  History   Socioeconomic History   Marital status: Widowed    Spouse name: Not on file   Number of children: Not on file   Years of education: Not on file   Highest education level: Not on file  Occupational History   Not on file  Tobacco Use   Smoking status: Every Day    Packs/day: 1.00    Types: Cigarettes   Smokeless tobacco: Never  Vaping Use   Vaping Use: Never used  Substance and Sexual Activity   Alcohol use: No   Drug use: No   Sexual activity: Not Currently    Birth control/protection: Surgical, Post-menopausal    Comment: tubal  Other Topics Concern   Not on file  Social History Narrative   Not on file   Social Determinants  of Health   Financial Resource Strain: Medium Risk (12/24/2019)   Overall Financial Resource Strain (CARDIA)    Difficulty of Paying Living Expenses: Somewhat hard  Food Insecurity: Food Insecurity Present (12/24/2019)   Hunger Vital Sign    Worried About Running Out of Food in the Last Year: Sometimes true    Ran Out of Food in the Last Year: Sometimes true  Transportation Needs: No Transportation Needs (12/24/2019)   PRAPARE - Hydrologist (Medical): No    Lack of Transportation (Non-Medical): No  Physical Activity: Inactive (12/24/2019)   Exercise Vital Sign    Days of Exercise per Week: 0 days    Minutes of Exercise per Session: 0 min  Stress: No Stress Concern Present (12/24/2019)   [place]    Feeling of Stress : Only a little  Social Connections: Socially Isolated (12/24/2019)   Social Connection and Isolation Panel [NHANES]    Frequency of Communication with Friends and Family: Once a week    Frequency of Social Gatherings with Friends and Family: Never    Attends Religious Services: More than 4 times per year    Active Member of [employer] or Organizations: No    Attends Archivist Meetings: Never    Marital Status: Widowed  Intimate Partner Violence: Not At Risk (12/24/2019)   Humiliation, Afraid, Rape, and Kick questionnaire    Fear of Current or Ex-Partner: No    Emotionally Abused: No    Physically Abused: No    Sexually Abused: No    Subjective: Review of Systems  Constitutional:  Negative for chills and fever.  HENT:  Negative for congestion and hearing loss.   Eyes:  Negative for blurred vision and double vision.  Respiratory:  Negative for cough and shortness of breath.   Cardiovascular:  Negative for chest pain and palpitations.  Gastrointestinal:  Positive for abdominal pain, constipation and heartburn. Negative for blood in stool, diarrhea, melena and vomiting.   Genitourinary:  Negative for dysuria and urgency.  Musculoskeletal:  Negative for joint pain and myalgias.  Skin:  Negative for itching and rash.  Neurological:  Negative for dizziness and headaches.  Psychiatric/Behavioral:  Negative for depression. The patient is not nervous/anxious.        Objective: BP 137/72   Pulse 78   Temp 98.6 F (37 C)   Ht 5' (1.524 m)   Wt 122 lb (55.3 kg)   BMI 23.83 kg/m  Physical Exam Constitutional:      Appearance: Normal appearance.  HENT:     Head: Normocephalic and atraumatic.  Eyes:     Extraocular Movements: Extraocular  movements intact.     Conjunctiva/sclera: Conjunctivae normal.  Cardiovascular:     Rate and Rhythm: Normal rate and regular rhythm.  Pulmonary:     Effort: Pulmonary effort is normal.     Breath sounds: Normal breath sounds.  Abdominal:     General: Bowel sounds are normal.     Palpations: Abdomen is soft.  Musculoskeletal:        General: No swelling. Normal range of motion.     Cervical back: Normal range of motion and neck supple.  Skin:    General: Skin is warm and dry.     Coloration: Skin is not jaundiced.  Neurological:     General: No focal deficit present.     Mental Status: She is alert and oriented to person, place, and time.  Psychiatric:        Mood and Affect: Mood normal.        Behavior: Behavior normal.      Assessment: *Positive Cologuard *Chronic constipation *Chronic GERD-uncontrolled  Plan: Will schedule for screening colonoscopy.The risks including infection, bleed, or perforation as well as benefits, limitations, alternatives and imponderables have been reviewed with the patient. Questions have been answered. All parties agreeable.  Will restart back on Famotidine for chronic GERD. Prescription sent to pharmacy today.   For her chronic constipation, no longer on Linzess. States this is controlled with diet. Continue to monitor.   02/17/2022 2:11 PM   Disclaimer: This note  was dictated with voice recognition software. Similar sounding words can inadvertently be transcribed and may not be corrected upon review.

## 2022-02-17 NOTE — Patient Instructions (Signed)
We will schedule you for colonoscopy given your recent positive Cologuard testing.  I have restarted your famotidine for your chronic reflux and sent this to [facility].  It was very nice seeing you again today.  Dr. Abbey Chatters

## 2022-04-07 ENCOUNTER — Encounter (INDEPENDENT_AMBULATORY_CARE_PROVIDER_SITE_OTHER): Payer: Self-pay | Admitting: *Deleted

## 2022-04-07 ENCOUNTER — Telehealth (INDEPENDENT_AMBULATORY_CARE_PROVIDER_SITE_OTHER): Payer: Self-pay | Admitting: *Deleted

## 2022-04-07 NOTE — Telephone Encounter (Signed)
Called pt. She has been scheduled with Dr. Abbey Chatters TCS, asa 3 on 1/29 at 215pm. Aware will leave prep sample and instructions to pick up at front desk at [address]. She will pick up Monday.    PA approved via [facility]. Auth# [id number], DOS: Apr 18, 2022 - Jul 17, 2022

## 2022-04-07 NOTE — Telephone Encounter (Signed)
LMOVM with pre-op appt information

## 2022-04-13 NOTE — Patient Instructions (Signed)
[name]  [birth year]     '@PREFPERIOPPHARMACY'$ @   Your procedure is scheduled on  04/18/2022.   Report to [facility] at  1200  P.M.   Call this number if you have problems the morning of surgery:  [phone]  If you experience any cold or flu symptoms such as cough, fever, chills, shortness of breath, etc. between now and your scheduled surgery, please notify us at the above number.   Remember:  Follow the diet and prep instructions given to you by the office.     Use your inhalers before you come and bring your rescue inhaler with you.     Take these medicines the morning of surgery with A SIP OF WATER      cymbalta, pepcid, gabapentin, levothyroxine, claritin.     Do not wear jewelry, make-up or nail polish.  Do not wear lotions, powders, or perfumes, or deodorant.  Do not shave 48 hours prior to surgery.  Men may shave face and neck.  Do not bring valuables to the hospital.  [facility] is not responsible for any belongings or valuables.  Contacts, dentures or bridgework may not be worn into surgery.  Leave your suitcase in the car.  After surgery it may be brought to your room.  For patients admitted to the hospital, discharge time will be determined by your treatment team.  Patients discharged the day of surgery will not be allowed to drive home and must have someone with them for 24 hours.    Special instructions:   DO NOT smoke tobacco or vape for 24 hours before your procedure.  Please read over the following fact sheets that you were given. Anesthesia Post-op Instructions and Care and Recovery After Surgery      Colonoscopy, Adult, Care After The following information offers guidance on how to care for yourself after your procedure. Your health care provider may also give you more specific instructions. If you have problems or questions, contact your health care provider. What can I expect after the procedure? After the procedure, it is common to  have: A small amount of blood in your stool for 24 hours after the procedure. Some gas. Mild cramping or bloating of your abdomen. Follow these instructions at home: Eating and drinking  Drink enough fluid to keep your urine pale yellow. Follow instructions from your health care provider about eating or drinking restrictions. Resume your normal diet as told by your health care provider. Avoid heavy or fried foods that are hard to digest. Activity Rest as told by your health care provider. Avoid sitting for a long time without moving. Get up to take short walks every 1-2 hours. This is important to improve blood flow and breathing. Ask for help if you feel weak or unsteady. Return to your normal activities as told by your health care provider. Ask your health care provider what activities are safe for you. Managing cramping and bloating  Try walking around when you have cramps or feel bloated. If directed, apply heat to your abdomen as told by your health care provider. Use the heat source that your health care provider recommends, such as a moist heat pack or a heating pad. Place a towel between your skin and the heat source. Leave the heat on for 20-30 minutes. Remove the heat if your skin turns bright red. This is especially important if you are unable to feel pain, heat, or cold. You have a greater risk  of getting burned. General instructions If you were given a sedative during the procedure, it can affect you for several hours. Do not drive or operate machinery until your health care provider says that it is safe. For the first 24 hours after the procedure: Do not sign important documents. Do not drink alcohol. Do your regular daily activities at a slower pace than normal. Eat soft foods that are easy to digest. Take over-the-counter and prescription medicines only as told by your health care provider. Keep all follow-up visits. This is important. Contact a health care provider  if: You have blood in your stool 2-3 days after the procedure. Get help right away if: You have more than a small spotting of blood in your stool. You have large blood clots in your stool. You have swelling of your abdomen. You have nausea or vomiting. You have a fever. You have increasing pain in your abdomen that is not relieved with medicine. These symptoms may be an emergency. Get help right away. Call 911. Do not wait to see if the symptoms will go away. Do not drive yourself to the hospital. Summary After the procedure, it is common to have a small amount of blood in your stool. You may also have mild cramping and bloating of your abdomen. If you were given a sedative during the procedure, it can affect you for several hours. Do not drive or operate machinery until your health care provider says that it is safe. Get help right away if you have a lot of blood in your stool, nausea or vomiting, a fever, or increased pain in your abdomen. This information is not intended to replace advice given to you by your health care provider. Make sure you discuss any questions you have with your health care provider. Document Revised: 10/28/2020 Document Reviewed: 10/28/2020 Elsevier Patient Education  [place] After The following information offers guidance on how to care for yourself after your procedure. Your health care provider may also give you more specific instructions. If you have problems or questions, contact your health care provider. What can I expect after the procedure? After the procedure, it is common to have: Tiredness. Little or no memory about what happened during or after the procedure. Impaired judgment when it comes to making decisions. Nausea or vomiting. Some trouble with balance. Follow these instructions at home: For the time period you were told by your health care provider:  Rest. Do not participate in activities where  you could fall or become injured. Do not drive or use machinery. Do not drink alcohol. Do not take sleeping pills or medicines that cause drowsiness. Do not make important decisions or sign legal documents. Do not take care of children on your own. Medicines Take over-the-counter and prescription medicines only as told by your health care provider. If you were prescribed antibiotics, take them as told by your health care provider. Do not stop using the antibiotic even if you start to feel better. Eating and drinking Follow instructions from your health care provider about what you may eat and drink. Drink enough fluid to keep your urine pale yellow. If you vomit: Drink clear fluids slowly and in small amounts as you are able. Clear fluids include water, ice chips, low-calorie sports drinks, and fruit juice that has water added to it (diluted fruit juice). Eat light and bland foods in small amounts as you are able. These foods include bananas, applesauce, rice, lean meats, toast, and  crackers. General instructions  Have a responsible adult stay with you for the time you are told. It is important to have someone help care for you until you are awake and alert. If you have sleep apnea, surgery and some medicines can increase your risk for breathing problems. Follow instructions from your health care provider about wearing your sleep device: When you are sleeping. This includes during daytime naps. While taking prescription pain medicines, sleeping medicines, or medicines that make you drowsy. Do not use any products that contain nicotine or tobacco. These products include cigarettes, chewing tobacco, and vaping devices, such as e-cigarettes. If you need help quitting, ask your health care provider. Contact a health care provider if: You feel nauseous or vomit every time you eat or drink. You feel light-headed. You are still sleepy or having trouble with balance after 24 hours. You get a  rash. You have a fever. You have redness or swelling around the IV site. Get help right away if: You have trouble breathing. You have new confusion after you get home. These symptoms may be an emergency. Get help right away. Call 911. Do not wait to see if the symptoms will go away. Do not drive yourself to the hospital. This information is not intended to replace advice given to you by your health care provider. Make sure you discuss any questions you have with your health care provider. Document Revised: 08/02/2021 Document Reviewed: 08/02/2021 Elsevier Patient Education  [place].

## 2022-04-14 ENCOUNTER — Encounter (HOSPITAL_COMMUNITY)
Admission: RE | Admit: 2022-04-14 | Discharge: 2022-04-14 | Disposition: A | Discharge status: Home / Self Care | Payer: Medicare Other | Source: Ambulatory Visit | Attending: Internal Medicine | Admitting: Internal Medicine

## 2022-04-14 ENCOUNTER — Encounter (HOSPITAL_COMMUNITY): Payer: Self-pay

## 2022-04-14 VITALS — BP 120/64 | HR 80 | Temp 98.5°F | Resp 18 | Ht 60.0 in | Wt 121.9 lb

## 2022-04-14 DIAGNOSIS — E119 Type 2 diabetes mellitus without complications: Secondary | ICD-10-CM | POA: Insufficient documentation

## 2022-04-14 DIAGNOSIS — Z01812 Encounter for preprocedural laboratory examination: Secondary | ICD-10-CM | POA: Insufficient documentation

## 2022-04-14 HISTORY — DX: Gastro-esophageal reflux disease without esophagitis: K21.9

## 2022-04-14 HISTORY — DX: Hypothyroidism, unspecified: E03.9

## 2022-04-14 HISTORY — DX: Chronic obstructive pulmonary disease, unspecified: J44.9

## 2022-04-14 LAB — BASIC METABOLIC PANEL
Anion gap: 10 (ref 5–15)
BUN: 17 mg/dL (ref 8–23)
CO2: 24 mmol/L (ref 22–32)
Calcium: 8.8 mg/dL — ABNORMAL LOW (ref 8.9–10.3)
Chloride: 101 mmol/L (ref 98–111)
Creatinine, Ser: 0.91 mg/dL (ref 0.44–1.00)
GFR, Estimated: >60 mL/min (ref >60)
Glucose, Bld: 107 mg/dL — ABNORMAL HIGH (ref 70–99)
Potassium: 3.5 mmol/L (ref 3.5–5.1)
Sodium: 135 mmol/L (ref 135–145)

## 2022-04-18 ENCOUNTER — Encounter: Payer: Self-pay | Admitting: *Deleted

## 2022-04-18 ENCOUNTER — Telehealth: Payer: Self-pay | Admitting: *Deleted

## 2022-04-18 DIAGNOSIS — R195 Other fecal abnormalities: Secondary | ICD-10-CM

## 2022-04-18 NOTE — Telephone Encounter (Signed)
Pt left vm stating she did not pick up her prep and needed to reschedule procedure. She is scheduled for today at 2:15 pm

## 2022-04-18 NOTE — Telephone Encounter (Signed)
Pt has been rescheduled until 05/02/22 at 9:15 am, new instructions have been printed. Pt instructed to come by office to get prep and new instructions

## 2022-04-25 ENCOUNTER — Encounter: Payer: Self-pay | Admitting: *Deleted

## 2022-04-28 ENCOUNTER — Encounter (HOSPITAL_COMMUNITY): Payer: Self-pay

## 2022-04-28 ENCOUNTER — Encounter (HOSPITAL_COMMUNITY)
Admission: RE | Admit: 2022-04-28 | Discharge: 2022-04-28 | Disposition: A | Discharge status: Home / Self Care | Payer: Medicare Other | Source: Ambulatory Visit | Attending: Internal Medicine | Admitting: Internal Medicine

## 2022-04-28 HISTORY — DX: Other specified postprocedural states: Z98.890

## 2022-04-28 HISTORY — DX: Nausea with vomiting, unspecified: R11.2

## 2022-05-02 ENCOUNTER — Encounter (HOSPITAL_COMMUNITY)
Admission: RE | Disposition: A | Discharge status: Home / Self Care | Payer: Self-pay | Source: Ambulatory Visit | Attending: Internal Medicine

## 2022-05-02 ENCOUNTER — Encounter (HOSPITAL_COMMUNITY): Payer: Self-pay

## 2022-05-02 ENCOUNTER — Ambulatory Visit (HOSPITAL_COMMUNITY): Payer: Medicare Other | Admitting: Anesthesiology

## 2022-05-02 ENCOUNTER — Ambulatory Visit (HOSPITAL_BASED_OUTPATIENT_CLINIC_OR_DEPARTMENT_OTHER): Payer: Medicare Other | Admitting: Anesthesiology

## 2022-05-02 ENCOUNTER — Ambulatory Visit (HOSPITAL_COMMUNITY)
Admission: RE | Admit: 2022-05-02 | Discharge: 2022-05-02 | Disposition: A | Discharge status: Home / Self Care | Payer: Medicare Other | Source: Ambulatory Visit | Attending: Internal Medicine | Admitting: Internal Medicine

## 2022-05-02 DIAGNOSIS — I10 Essential (primary) hypertension: Secondary | ICD-10-CM | POA: Diagnosis not present

## 2022-05-02 DIAGNOSIS — J449 Chronic obstructive pulmonary disease, unspecified: Secondary | ICD-10-CM | POA: Diagnosis not present

## 2022-05-02 DIAGNOSIS — D120 Benign neoplasm of cecum: Secondary | ICD-10-CM | POA: Diagnosis not present

## 2022-05-02 DIAGNOSIS — K635 Polyp of colon: Secondary | ICD-10-CM

## 2022-05-02 DIAGNOSIS — F32A Depression, unspecified: Secondary | ICD-10-CM | POA: Insufficient documentation

## 2022-05-02 DIAGNOSIS — D124 Benign neoplasm of descending colon: Secondary | ICD-10-CM

## 2022-05-02 DIAGNOSIS — E119 Type 2 diabetes mellitus without complications: Secondary | ICD-10-CM | POA: Insufficient documentation

## 2022-05-02 DIAGNOSIS — Z1211 Encounter for screening for malignant neoplasm of colon: Secondary | ICD-10-CM | POA: Insufficient documentation

## 2022-05-02 DIAGNOSIS — K219 Gastro-esophageal reflux disease without esophagitis: Secondary | ICD-10-CM | POA: Insufficient documentation

## 2022-05-02 DIAGNOSIS — D123 Benign neoplasm of transverse colon: Secondary | ICD-10-CM

## 2022-05-02 DIAGNOSIS — I2510 Atherosclerotic heart disease of native coronary artery without angina pectoris: Secondary | ICD-10-CM

## 2022-05-02 DIAGNOSIS — E039 Hypothyroidism, unspecified: Secondary | ICD-10-CM | POA: Diagnosis not present

## 2022-05-02 DIAGNOSIS — F17210 Nicotine dependence, cigarettes, uncomplicated: Secondary | ICD-10-CM

## 2022-05-02 DIAGNOSIS — R195 Other fecal abnormalities: Secondary | ICD-10-CM

## 2022-05-02 HISTORY — PX: SUBMUCOSAL LIFTING INJECTION: SHX6855

## 2022-05-02 HISTORY — PX: COLONOSCOPY WITH PROPOFOL: SHX5780

## 2022-05-02 HISTORY — PX: HEMOSTASIS CLIP PLACEMENT: SHX6857

## 2022-05-02 HISTORY — PX: POLYPECTOMY: SHX5525

## 2022-05-02 LAB — GLUCOSE, CAPILLARY: Glucose-Capillary: 109 mg/dL — ABNORMAL HIGH (ref 70–99)

## 2022-05-02 SURGERY — COLONOSCOPY WITH PROPOFOL
Anesthesia: General

## 2022-05-02 MED ORDER — PROPOFOL 500 MG/50ML IV EMUL
INTRAVENOUS | Status: DC | PRN
  Administered 2022-05-02: 100 ug/kg/min via INTRAVENOUS

## 2022-05-02 MED ORDER — LACTATED RINGERS IV SOLN: INTRAVENOUS | Status: DC

## 2022-05-02 MED ORDER — PHENYLEPHRINE HCL (PRESSORS) 10 MG/ML IV SOLN
INTRAVENOUS | Status: DC | PRN
  Administered 2022-05-02 (×4): 80 ug via INTRAVENOUS

## 2022-05-02 MED ORDER — PROPOFOL 10 MG/ML IV BOLUS
INTRAVENOUS | Status: DC | PRN
  Administered 2022-05-02: 100 mg via INTRAVENOUS

## 2022-05-02 NOTE — Discharge Instructions (Addendum)
  Colonoscopy Discharge Instructions  Read the instructions outlined below and refer to this sheet in the next few weeks. These discharge instructions provide you with general information on caring for yourself after you leave the hospital. Your doctor may also give you specific instructions. While your treatment has been planned according to the most current medical practices available, unavoidable complications occasionally occur.   ACTIVITY You may resume your regular activity, but move at a slower pace for the next 24 hours.  Take frequent rest periods for the next 24 hours.  Walking will help get rid of the air and reduce the bloated feeling in your belly (abdomen).  No driving for 24 hours (because of the medicine (anesthesia) used during the test).   Do not sign any important legal documents or operate any machinery for 24 hours (because of the anesthesia used during the test).  NUTRITION Drink plenty of fluids.  You may resume your normal diet as instructed by your doctor.  Begin with a light meal and progress to your normal diet. Heavy or fried foods are harder to digest and may make you feel sick to your stomach (nauseated).  Avoid alcoholic beverages for 24 hours or as instructed.  MEDICATIONS You may resume your normal medications unless your doctor tells you otherwise.  WHAT YOU CAN EXPECT TODAY Some feelings of bloating in the abdomen.  Passage of more gas than usual.  Spotting of blood in your stool or on the toilet paper.  IF YOU HAD POLYPS REMOVED DURING THE COLONOSCOPY: No aspirin products for 7 days or as instructed.  No alcohol for 7 days or as instructed.  Eat a soft diet for the next 24 hours.  FINDING OUT THE RESULTS OF YOUR TEST Not all test results are available during your visit. If your test results are not back during the visit, make an appointment with your caregiver to find out the results. Do not assume everything is normal if you have not heard from your  caregiver or the medical facility. It is important for you to follow up on all of your test results.  SEEK IMMEDIATE MEDICAL ATTENTION IF: You have more than a spotting of blood in your stool.  Your belly is swollen (abdominal distention).  You are nauseated or vomiting.  You have a temperature over 101.  You have abdominal pain or discomfort that is severe or gets worse throughout the day.   Your colonoscopy revealed 6 polyp(s) which I removed successfully. One was quite large requiring in depth resection.  To close the defect after polypectomy I did place 4 metallic clips.  These will naturally fall off over the next few months.  If you need an MRI for any reason in the near future, you will need x-ray prior.  Await pathology results, my office will contact you. I recommend repeating colonoscopy in 3 years for surveillance purposes. Otherwise follow up with GI as needed.    I hope you have a great rest of your week!  Elon Alas. Abbey Chatters, D.O. Gastroenterology and Hepatology [facility]

## 2022-05-02 NOTE — Op Note (Signed)
[facility] Patient Name: [name] Procedure Date: 05/02/2022 8:29 AM MRN: [id number] Date of Birth: 1955/09/27 Attending MD: Elon Alas. Abbey Chatters , [place], [id number] CSN: [id number] Age: 67 Admit Type: Outpatient Procedure:                Colonoscopy Indications:              Screening for colorectal malignant neoplasm,                            Incidental - Positive Cologuard test Providers:                Elon Alas. Abbey Chatters, DO, Caprice Kluver, Aram Candela Referring MD:              Medicines:                See the Anesthesia note for documentation of the                            administered medications Complications:            No immediate complications. Estimated Blood Loss:     Estimated blood loss was minimal. Procedure:                Pre-Anesthesia Assessment:                           - The anesthesia plan was to use monitored                            anesthesia care (MAC).                           After obtaining informed consent, the colonoscope                            was passed under direct vision. Throughout the                            procedure, the patient's blood pressure, pulse, and                            oxygen saturations were monitored continuously. The                            [id number]) was introduced through the                            anus and advanced to the the cecum, identified by                            appendiceal orifice and ileocecal valve. The                            colonoscopy was performed without difficulty. The                            patient tolerated the  procedure well. The quality                            of the bowel preparation was evaluated using the                            BBPS [facility] Bowel Preparation Scale) with scores                            of: Right Colon = 3, Transverse Colon = 3 and Left                            Colon = 3 (entire mucosa seen well with no residual                             staining, small fragments of stool or opaque                            liquid). The total BBPS score equals 9. Scope In: 8:41:23 AM Scope Out: 9:26:10 AM Scope Withdrawal Time: 0 hours 40 minutes 58 seconds  Total Procedure Duration: 0 hours 44 minutes 47 seconds  Findings:      The perianal and digital rectal examinations were normal.      Five sessile polyps were found in the descending colon and transverse       colon. The polyps were 4 to 8 mm in size. These polyps were removed with       a cold snare. Resection and retrieval were complete.      A, approx. 15 mm polyp was found in the cecum. The polyp was flat.       Preparations were made for mucosal resection. Demarcation of the lesion       was performed during the procedure to clearly identify the boundaries of       the lesion. 5 mL of [place] was injected with adequate lift of the       lesion from the muscularis propria. Snare mucosal resection with Jabier Mutton       net retrieval was performed. Resection and retrieval were complete.       Resected tissue margins were examined and clear of polyp tissue. There       was no bleeding during and at the end of the procedure. To close a       defect after mucosal resection, four hemostatic clips were successfully       placed (MR conditional). Clip manufacturer: [employer]. There was       no bleeding at the end of the procedure. One clip malfunctioned. Removed       from colon with snare.      The exam was otherwise without abnormality. Impression:               - Five 4 to 8 mm polyps in the descending colon and                            in the transverse colon, removed with a cold snare.  Resected and retrieved.                           - One 15 mm polyp in the cecum, removed with                            mucosal resection. Resected and retrieved. Clips                            (MR conditional) were placed. Clip manufacturer:                             [employer].                           - The examination was otherwise normal.                           - Mucosal resection was performed. Resection and                            retrieval were complete. Moderate Sedation:      Per Anesthesia Care Recommendation:           - Patient has a contact number available for                            emergencies. The signs and symptoms of potential                            delayed complications were discussed with the                            patient. Return to normal activities tomorrow.                            Written discharge instructions were provided to the                            patient.                           - Resume previous diet.                           - Continue present medications.                           - Await pathology results.                           - Repeat colonoscopy in 3 years for surveillance.                           - Return to GI clinic PRN. Procedure Code(s):        --- Professional ---  [id number], Colonoscopy, flexible; with endoscopic                            mucosal resection                           45385, 69, Colonoscopy, flexible; with removal of                            tumor(s), polyp(s), or other lesion(s) by snare                            technique Diagnosis Code(s):        --- Professional ---                           Z12.11, Encounter for screening for malignant                            neoplasm of colon                           D12.4, Benign neoplasm of descending colon                           D12.3, Benign neoplasm of transverse colon (hepatic                            flexure or splenic flexure)                           D12.0, Benign neoplasm of cecum CPT copyright 2022 American Medical Association. All rights reserved. The codes documented in this report are preliminary and upon coder review may  be revised to meet  current compliance requirements. Elon Alas. Abbey Chatters, DO [place] Abbey Chatters, DO 05/02/2022 9:31:29 AM This report has been signed electronically. Number of Addenda: 0

## 2022-05-02 NOTE — Anesthesia Preprocedure Evaluation (Signed)
Anesthesia Evaluation  Patient identified by MRN, date of birth, ID band Patient awake    Reviewed: Allergy & Precautions, H&P , NPO status , Patient's Chart, lab work & pertinent test results, reviewed documented beta blocker date and time   History of Anesthesia Complications (+) PONV and history of anesthetic complications  Airway Mallampati: II  TM Distance: >3 FB Neck ROM: full    Dental no notable dental hx.    Pulmonary neg pulmonary ROS, COPD, Current Smoker   Pulmonary exam normal breath sounds clear to auscultation       Cardiovascular Exercise Tolerance: Good hypertension, + CAD  negative cardio ROS  Rhythm:regular Rate:Normal     Neuro/Psych Seizures -,  PSYCHIATRIC DISORDERS Anxiety Depression Bipolar Disorder   negative neurological ROS  negative psych ROS   GI/Hepatic negative GI ROS, Neg liver ROS,GERD  ,,  Endo/Other  negative endocrine ROSdiabetesHypothyroidism    Renal/GU negative Renal ROS  negative genitourinary   Musculoskeletal   Abdominal   Peds  Hematology negative hematology ROS (+)   Anesthesia Other Findings   Reproductive/Obstetrics negative OB ROS                             Anesthesia Physical Anesthesia Plan  ASA: 3  Anesthesia Plan: General   Post-op Pain Management:    Induction:   PONV Risk Score and Plan: Propofol infusion  Airway Management Planned:   Additional Equipment:   Intra-op Plan:   Post-operative Plan:   Informed Consent: I have reviewed the patients History and Physical, chart, labs and discussed the procedure including the risks, benefits and alternatives for the proposed anesthesia with the patient or authorized representative who has indicated his/her understanding and acceptance.     Dental Advisory Given  Plan Discussed with: CRNA  Anesthesia Plan Comments:        Anesthesia Quick Evaluation

## 2022-05-02 NOTE — H&P (Signed)
Primary Care Physician:  [facility] Primary Gastroenterologist:  Dr. Abbey Chatters  Pre-Procedure History & Physical: HPI:  [name] is a 67 y.o. female is here  for a colonoscopy to be performed for positive cologuard testing  Past Medical History:  Diagnosis Date   COPD (chronic obstructive pulmonary disease) ([place])    Coronary artery disease    Depression    Diabetes mellitus without complication (HCC)    GERD (gastroesophageal reflux disease)    History of syphilis    Hypertension    Hypothyroidism    PONV (postoperative nausea and vomiting)    Seizures ([place])    Thyroid disease     Past Surgical History:  Procedure Laterality Date   APPENDECTOMY     TUBAL LIGATION      Prior to Admission medications   Medication Sig Start Date End Date Taking? Authorizing Provider  Accu-Chek Softclix Lancets lancets daily. 09/06/21  Yes [provider]  acetaminophen (TYLENOL) 500 MG tablet Take 500 mg by mouth every 6 (six) hours as needed for moderate pain or headache.   Yes [provider]  Ascorbic Acid (VITAMIN C PO) Take 1 tablet by mouth daily.   Yes [provider]  calcium carbonate (TUMS EX) 750 MG chewable tablet Chew 1,500 mg by mouth daily as needed for heartburn.   Yes [provider]  DULoxetine (CYMBALTA) 30 MG capsule Take 1 capsule (30 mg total) by mouth 2 (two) times daily. 07/23/21 05/02/22 Yes Massengill, Ovid Curd, MD  famotidine (PEPCID) 20 MG tablet Take 1 tablet (20 mg total) by mouth 3 (three) times daily. Patient taking differently: Take 20 mg by mouth 3 (three) times daily as needed for heartburn. 02/17/22 02/17/23 Yes Lavarr President, Elon Alas, DO  gabapentin (NEURONTIN) 100 MG capsule Take 1 capsule (100 mg total) by mouth 3 (three) times daily. 07/23/21 05/02/22 Yes Massengill, Ovid Curd, MD  levothyroxine (SYNTHROID) 75 MCG tablet Take 1 tablet (75 mcg total) by mouth daily at 6 (six) AM. 07/24/21 05/02/22 Yes Massengill, Ovid Curd, MD   lisinopril-hydrochlorothiazide (PRINZIDE,ZESTORETIC) 10-12.5 MG tablet Take 1 tablet by mouth daily. Resume as in outpatient 08/11/15  Yes Kerrie Buffalo, NP  loratadine (CLARITIN) 10 MG tablet Take 10 mg by mouth daily. 02/08/22  Yes [provider]  Multiple Vitamins-Minerals (HAIR SKIN AND NAILS FORMULA) TABS Take 1 tablet by mouth 2 (two) times daily.   Yes [provider]  Olopatadine HCl (PATADAY OP) Place 1 drop into both eyes daily as needed (allergies).   Yes [provider]  potassium chloride (MICRO-K) 10 MEQ CR capsule Take 1 capsule by mouth daily. 01/15/20  Yes [provider]  rosuvastatin (CRESTOR) 20 MG tablet Take 20 mg by mouth daily. 09/12/21  Yes [provider]  ACCU-CHEK GUIDE test strip USE STRIP TO CHECK GLUCOSE ONCE DAILY 09/06/21   [provider]  albuterol (VENTOLIN HFA) 108 (90 Base) MCG/ACT inhaler Inhale 2 puffs into the lungs every 6 (six) hours as needed for shortness of breath. 01/15/20   [provider]  Blood Glucose Monitoring Suppl (ACCU-CHEK GUIDE) w/Device KIT daily. as directed 11/13/21   [provider]  mirtazapine (REMERON) 7.5 MG tablet Take 1 tablet (7.5 mg total) by mouth at bedtime. Patient not taking: Reported on 04/12/2022 07/23/21 04/12/22  Janine Limbo, MD    Allergies as of 04/07/2022 - Review Complete 02/17/2022  Allergen Reaction Noted   Ambien [zolpidem tartrate]  08/09/2015   Bee venom Nausea And Vomiting and Swelling  05/20/2014   Pamelor [nortriptyline hcl]  08/09/2015   Seroquel [quetiapine fumarate] Swelling 11/28/2014   Trazodone and nefazodone  08/09/2015   Buspar [buspirone] Palpitations 11/12/2012   Wellbutrin [bupropion] Palpitations 11/12/2012   Zoloft [sertraline hcl] Palpitations 11/12/2012    Family History  Problem Relation Age of Onset   Hypertension Father    Diabetes Mother    Asthma Son    Depression Maternal Uncle     Social History    Socioeconomic History   Marital status: Widowed    Spouse name: Not on file   Number of children: Not on file   Years of education: Not on file   Highest education level: Not on file  Occupational History   Not on file  Tobacco Use   Smoking status: Every Day    Packs/day: 1.00    Types: Cigarettes   Smokeless tobacco: Never  Vaping Use   Vaping Use: Never used  Substance and Sexual Activity   Alcohol use: No   Drug use: No   Sexual activity: Not Currently    Birth control/protection: Surgical, Post-menopausal    Comment: tubal  Other Topics Concern   Not on file  Social History Narrative   Not on file   Social Determinants of Health   Financial Resource Strain: Medium Risk (12/24/2019)   Overall Financial Resource Strain (CARDIA)    Difficulty of Paying Living Expenses: Somewhat hard  Food Insecurity: Food Insecurity Present (12/24/2019)   Hunger Vital Sign    Worried About Running Out of Food in the Last Year: Sometimes true    Ran Out of Food in the Last Year: Sometimes true  Transportation Needs: No Transportation Needs (12/24/2019)   PRAPARE - Hydrologist (Medical): No    Lack of Transportation (Non-Medical): No  Physical Activity: Inactive (12/24/2019)   Exercise Vital Sign    Days of Exercise per Week: 0 days    Minutes of Exercise per Session: 0 min  Stress: No Stress Concern Present (12/24/2019)   [place]    Feeling of Stress : Only a little  Social Connections: Socially Isolated (12/24/2019)   Social Connection and Isolation Panel [NHANES]    Frequency of Communication with Friends and Family: Once a week    Frequency of Social Gatherings with Friends and Family: Never    Attends Religious Services: More than 4 times per year    Active Member of [employer] or Organizations: No    Attends Archivist Meetings: Never    Marital Status: Widowed  Intimate  Partner Violence: Not At Risk (12/24/2019)   Humiliation, Afraid, Rape, and Kick questionnaire    Fear of Current or Ex-Partner: No    Emotionally Abused: No    Physically Abused: No    Sexually Abused: No    Review of Systems: See HPI, otherwise negative ROS  Physical Exam: Vital signs in last 24 hours: Temp:  [97.8 F (36.6 C)] 97.8 F (36.6 C) (02/12 0756) Pulse Rate:  [71] 71 (02/12 0756) Resp:  [17] 17 (02/12 0756) BP: (115)/(65) 115/65 (02/12 0756) SpO2:  [100 %] 100 % (02/12 0756) Weight:  [55.3 kg] 55.3 kg (02/12 0756)   General:   Alert,  Well-developed, well-nourished, pleasant and cooperative in NAD Head:  Normocephalic and atraumatic. Eyes:  Sclera clear, no icterus.   Conjunctiva pink. Ears:  Normal auditory acuity. Nose:  No deformity, discharge,  or lesions. Msk:  Symmetrical  without gross deformities. Normal posture. Extremities:  Without clubbing or edema. Neurologic:  Alert and  oriented x4;  grossly normal neurologically. Skin:  Intact without significant lesions or rashes. Psych:  Alert and cooperative. Normal mood and affect.  Impression/Plan: [name] is here for a colonoscopy to be performed for positive cologuard testing  The risks of the procedure including infection, bleed, or perforation as well as benefits, limitations, alternatives and imponderables have been reviewed with the patient. Questions have been answered. All parties agreeable.

## 2022-05-02 NOTE — Anesthesia Procedure Notes (Signed)
Date/Time: 05/02/2022 8:36 AM  Performed by: Vista Deck, CRNAPre-anesthesia Checklist: Patient identified, Emergency Drugs available, Suction available, Timeout performed and Patient being monitored Patient Re-evaluated:Patient Re-evaluated prior to induction Oxygen Delivery Method: Nasal Cannula

## 2022-05-02 NOTE — Transfer of Care (Signed)
Immediate Anesthesia Transfer of Care Note  Patient: [name]  Procedure(s) Performed: COLONOSCOPY WITH PROPOFOL POLYPECTOMY SUBMUCOSAL LIFTING INJECTION HEMOSTASIS CLIP PLACEMENT  Patient Location: Short Stay  Anesthesia Type:General  Level of Consciousness: awake  Airway & Oxygen Therapy: Patient Spontanous Breathing  Post-op Assessment: Report given to RN and Post -op Vital signs reviewed and stable  Post vital signs: Reviewed and stable  Last Vitals:  Vitals Value Taken Time  BP 111/58 05/02/22 0930  Temp 36.4 C 05/02/22 0930  Pulse 78 05/02/22 0930  Resp 20 05/02/22 0930  SpO2 100 % 05/02/22 0930    Last Pain:  Vitals:   05/02/22 0930  TempSrc: Oral  PainSc: 0-No pain         Complications: No notable events documented.

## 2022-05-03 LAB — SURGICAL PATHOLOGY

## 2022-05-03 NOTE — Anesthesia Postprocedure Evaluation (Signed)
Anesthesia Post Note  Patient: ALEXA FAVELA  Procedure(s) Performed: COLONOSCOPY WITH PROPOFOL POLYPECTOMY SUBMUCOSAL LIFTING INJECTION HEMOSTASIS CLIP PLACEMENT  Patient location during evaluation: Phase II Anesthesia Type: General Level of consciousness: awake Pain management: pain level controlled Vital Signs Assessment: post-procedure vital signs reviewed and stable Respiratory status: spontaneous breathing and respiratory function stable Cardiovascular status: blood pressure returned to baseline and stable Postop Assessment: no headache and no apparent nausea or vomiting Anesthetic complications: no Comments: Late entry   No notable events documented.   Last Vitals:  Vitals:   05/02/22 0756 05/02/22 0930  BP: 115/65 (!) 111/58  Pulse: 71 78  Resp: 17 20  Temp: 36.6 C 36.4 C  SpO2: 100% 100%    Last Pain:  Vitals:   05/02/22 0930  TempSrc: Oral  PainSc: 0-No pain                 Louann Sjogren

## 2022-05-06 ENCOUNTER — Encounter (HOSPITAL_COMMUNITY): Payer: Self-pay | Admitting: Internal Medicine

## 2022-05-24 ENCOUNTER — Ambulatory Visit: Payer: Medicare Other | Admitting: Adult Health

## 2022-05-29 IMAGING — CR DG RIBS W/ CHEST 3+V*L*
5 series · 5 of 5 positions shown · non-contrast
Comparison: 11/28/2014 chest radiograph and prior studies

CLINICAL DATA: Acute LEFT chest and rib pain following fall several
days ago. Initial encounter.

EXAM:
LEFT RIBS AND CHEST - 3+ VIEW

[w pa chest]
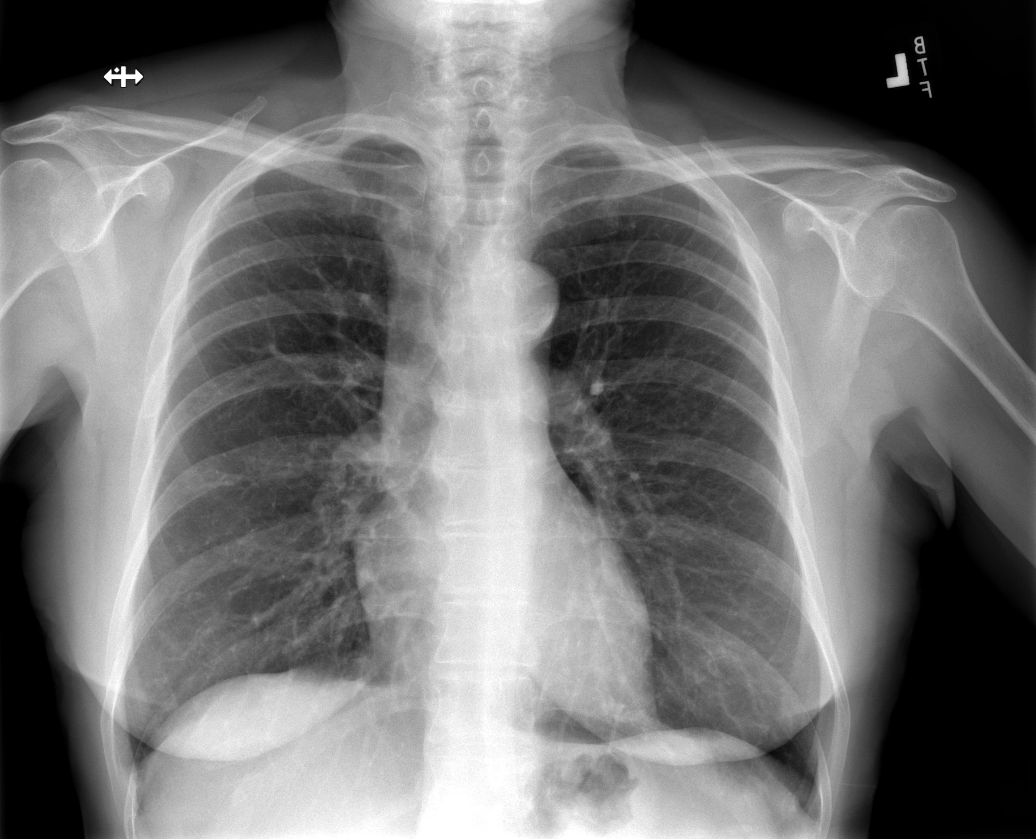

[w ribs ap upper left]
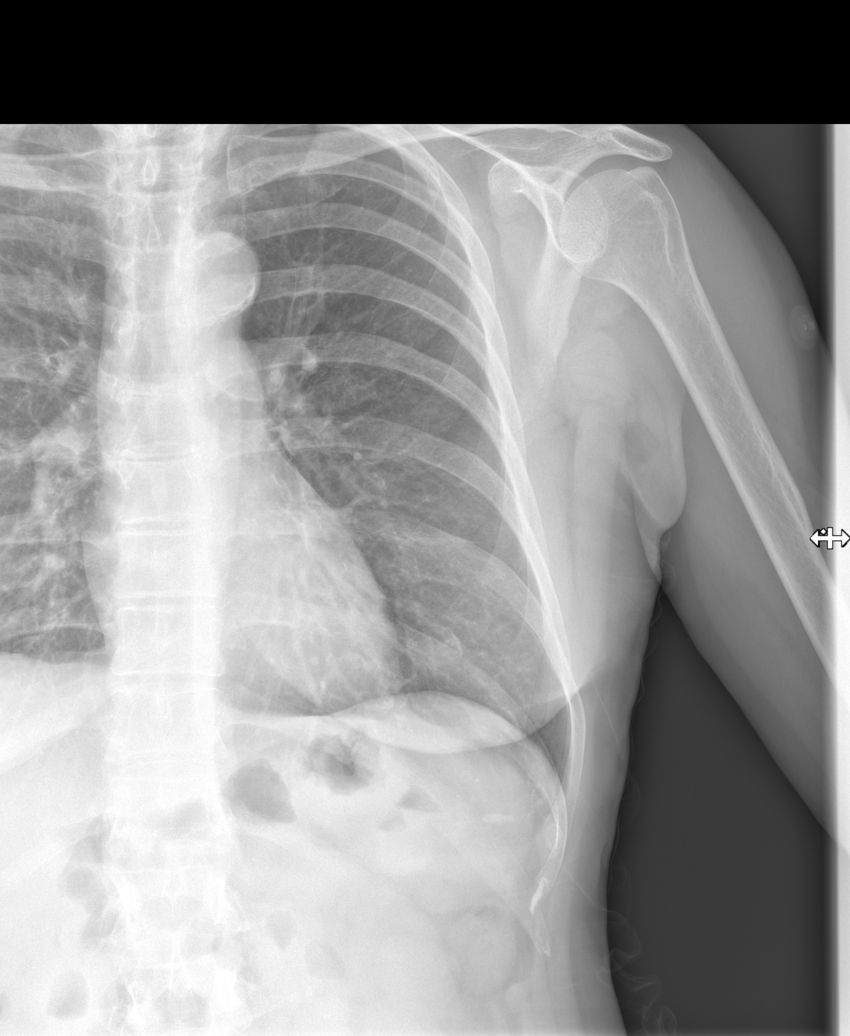

[w ribs ap lower left]
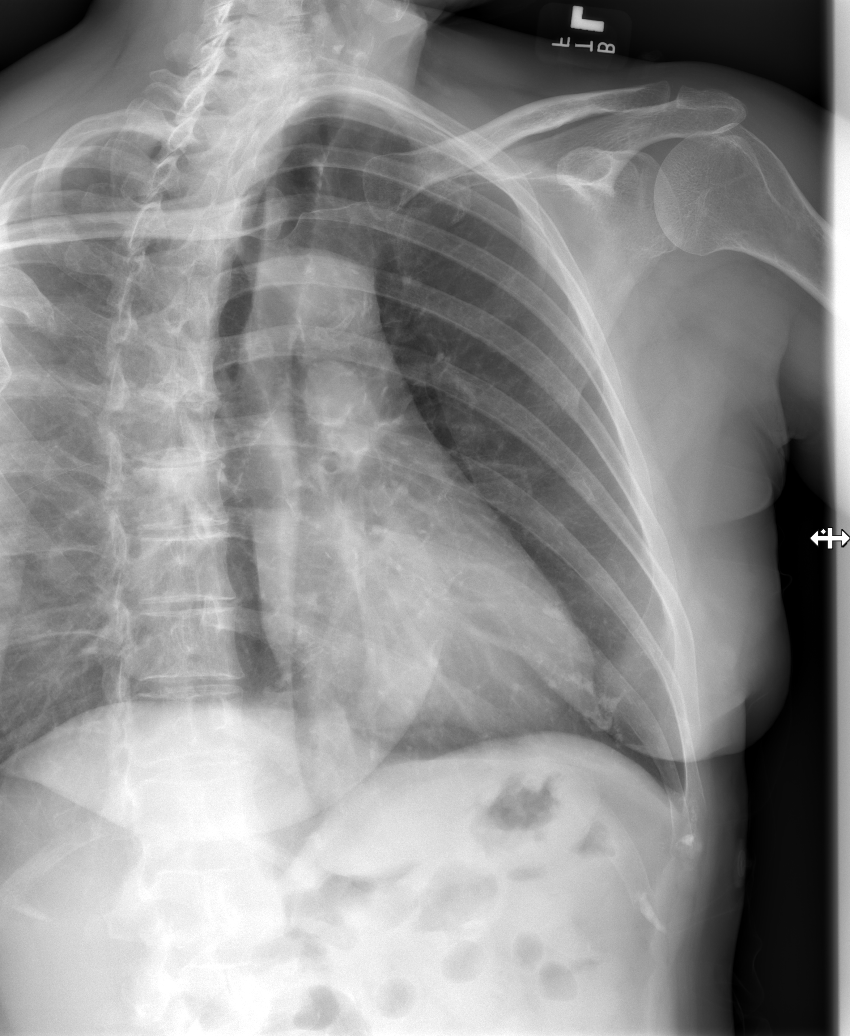

[w oblique left (1 of 2)]
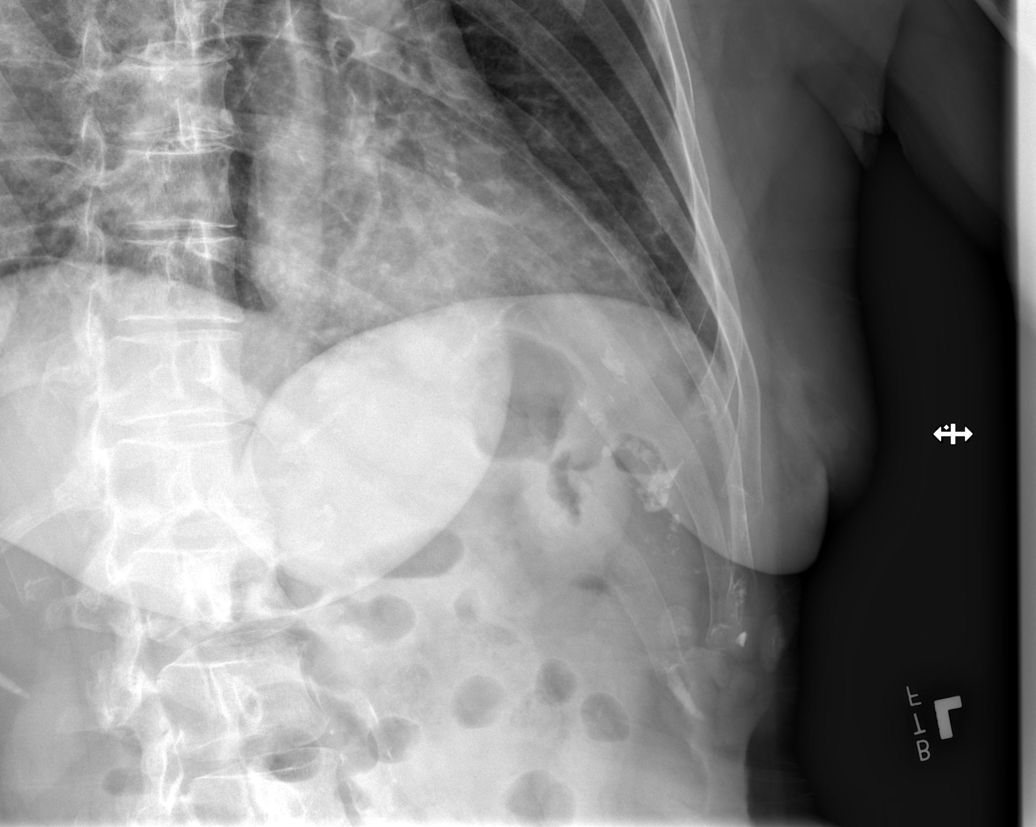

[w oblique left (2 of 2)]
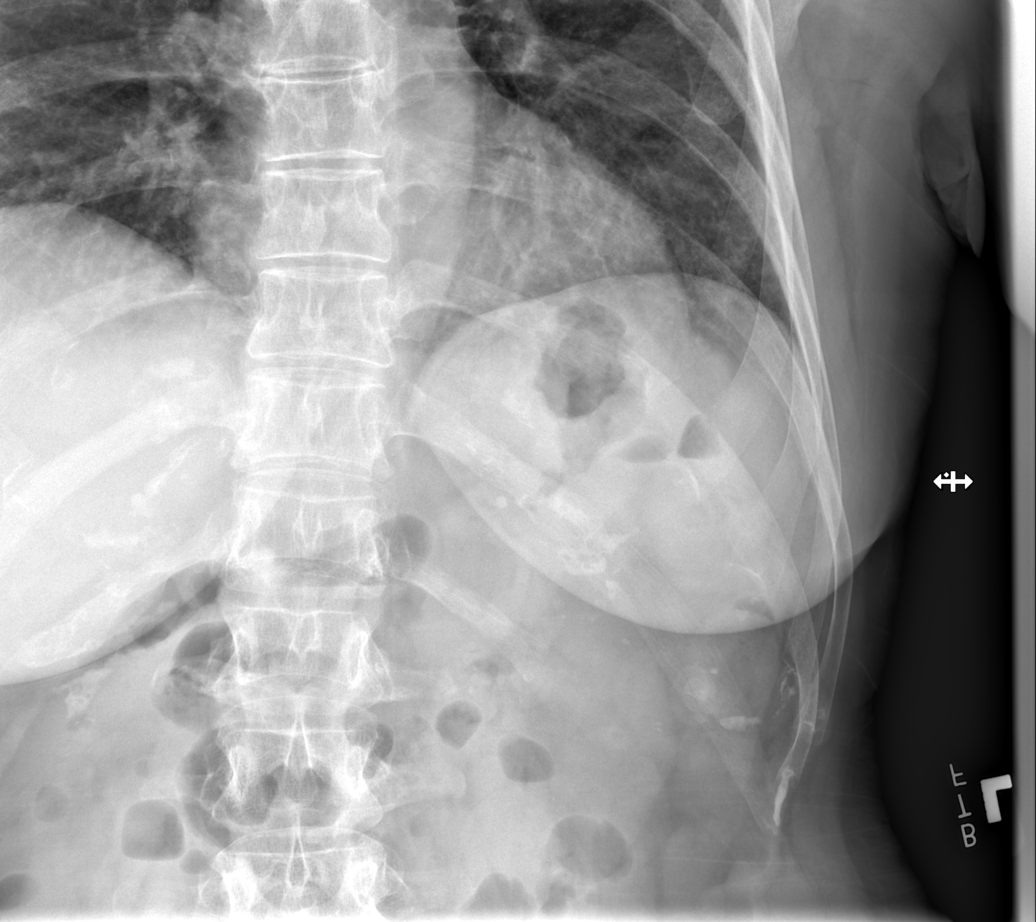

[5 of 5 positions shown; findings below may reference images not displayed]

FINDINGS: No fracture or other bone lesions are seen involving the ribs. There
is no evidence of pneumothorax or pleural effusion. Both lungs are
clear. Heart size and mediastinal contours are within normal limits.
IMPRESSION: Negative.

## 2022-06-09 ENCOUNTER — Encounter: Payer: Self-pay | Admitting: Advanced Practice Midwife

## 2022-06-09 ENCOUNTER — Ambulatory Visit: Payer: Medicare Other | Admitting: Advanced Practice Midwife

## 2022-06-09 VITALS — BP 125/75 | HR 75 | Ht 60.0 in | Wt 118.0 lb

## 2022-06-09 DIAGNOSIS — R350 Frequency of micturition: Secondary | ICD-10-CM

## 2022-06-09 DIAGNOSIS — M549 Dorsalgia, unspecified: Secondary | ICD-10-CM

## 2022-06-09 DIAGNOSIS — N898 Other specified noninflammatory disorders of vagina: Secondary | ICD-10-CM

## 2022-06-09 LAB — POCT URINALYSIS DIPSTICK
Glucose, UA: NEGATIVE
Ketones, UA: NEGATIVE
Leukocytes, UA: NEGATIVE
Nitrite, UA: NEGATIVE
Protein, UA: NEGATIVE

## 2022-06-09 MED ORDER — FLUCONAZOLE 150 MG PO TABS: ORAL_TABLET | ORAL | 2 refills | Status: DC

## 2022-06-09 MED ORDER — CEFADROXIL 500 MG PO CAPS: 500.0000 mg | ORAL_CAPSULE | Freq: Two times a day (BID) | ORAL | 0 refills | Status: DC

## 2022-06-09 NOTE — Progress Notes (Signed)
[facility] Visit  Patient name: [name] MRN [id number]  Date of birth: [id number]  CC & HPI:  [name] is a 67 y.o.  female presenting today for feeling like she has a UTI (LBP, dribbling). Had vaginal itch/DC, used OTC itch cream, felt relief.   Pertinent History Reviewed:  Medical & Surgical Hx:   Past Medical History:  Diagnosis Date   COPD (chronic obstructive pulmonary disease) ([place])    Coronary artery disease    Depression    Diabetes mellitus without complication (HCC)    GERD (gastroesophageal reflux disease)    History of syphilis    Hypertension    Hypothyroidism    PONV (postoperative nausea and vomiting)    Seizures ([place])    Thyroid disease    Past Surgical History:  Procedure Laterality Date   APPENDECTOMY     COLONOSCOPY WITH PROPOFOL N/A 05/02/2022   Procedure: COLONOSCOPY WITH PROPOFOL;  Surgeon: Eloise Harman, DO;  Location: [facility];  Service: Endoscopy;  Laterality: N/A;  215pm, asa 3   HEMOSTASIS CLIP PLACEMENT  05/02/2022   Procedure: HEMOSTASIS CLIP PLACEMENT;  Surgeon: Eloise Harman, DO;  Location: [facility];  Service: Endoscopy;;   POLYPECTOMY  05/02/2022   Procedure: POLYPECTOMY;  Surgeon: Eloise Harman, DO;  Location: [facility];  Service: Endoscopy;;   SUBMUCOSAL LIFTING INJECTION  05/02/2022   Procedure: SUBMUCOSAL LIFTING INJECTION;  Surgeon: Eloise Harman, DO;  Location: [facility];  Service: Endoscopy;;   TUBAL LIGATION     Family History  Problem Relation Age of Onset   Hypertension Father    Diabetes Mother    Asthma Son    Depression Maternal Uncle     Current Outpatient Medications:    ACCU-CHEK GUIDE test strip, USE STRIP TO CHECK GLUCOSE ONCE DAILY, Disp: , Rfl:    Accu-Chek Softclix Lancets lancets, daily., Disp: , Rfl:    acetaminophen (TYLENOL) 500 MG tablet, Take 500 mg by mouth every 6 (six) hours as needed for moderate pain or headache., Disp: , Rfl:    Ascorbic Acid (VITAMIN C  PO), Take 1 tablet by mouth daily., Disp: , Rfl:    Blood Glucose Monitoring Suppl (ACCU-CHEK GUIDE) w/Device KIT, daily. as directed, Disp: , Rfl:    DULoxetine (CYMBALTA) 30 MG capsule, Take 30 mg by mouth daily., Disp: , Rfl:    gabapentin (NEURONTIN) 100 MG capsule, Take 100 mg by mouth 3 (three) times daily., Disp: , Rfl:    levothyroxine (SYNTHROID) 75 MCG tablet, Take 75 mcg by mouth daily before breakfast., Disp: , Rfl:    lisinopril-hydrochlorothiazide (PRINZIDE,ZESTORETIC) 10-12.5 MG tablet, Take 1 tablet by mouth daily. Resume as in outpatient, Disp: 30 tablet, Rfl: 0   Multiple Vitamins-Minerals (HAIR SKIN AND NAILS FORMULA) TABS, Take 1 tablet by mouth 2 (two) times daily., Disp: , Rfl:    Olopatadine HCl (PATADAY OP), Place 1 drop into both eyes daily as needed (allergies)., Disp: , Rfl:    potassium chloride (MICRO-K) 10 MEQ CR capsule, Take 1 capsule by mouth daily., Disp: , Rfl:    rosuvastatin (CRESTOR) 20 MG tablet, Take 20 mg by mouth daily., Disp: , Rfl:    albuterol (VENTOLIN HFA) 108 (90 Base) MCG/ACT inhaler, Inhale 2 puffs into the lungs every 6 (six) hours as needed for shortness of breath. (Patient not taking: Reported on 06/09/2022), Disp: , Rfl:    calcium carbonate (TUMS EX) 750 MG chewable tablet, Chew 1,500  mg by mouth daily as needed for heartburn. (Patient not taking: Reported on 06/09/2022), Disp: , Rfl:    DULoxetine (CYMBALTA) 30 MG capsule, Take 1 capsule (30 mg total) by mouth 2 (two) times daily., Disp: 60 capsule, Rfl: 0   famotidine (PEPCID) 20 MG tablet, Take 1 tablet (20 mg total) by mouth 3 (three) times daily. (Patient taking differently: Take 20 mg by mouth 3 (three) times daily as needed for heartburn.), Disp: 270 tablet, Rfl: 3   gabapentin (NEURONTIN) 100 MG capsule, Take 1 capsule (100 mg total) by mouth 3 (three) times daily., Disp: 90 capsule, Rfl: 0   levothyroxine (SYNTHROID) 75 MCG tablet, Take 1 tablet (75 mcg total) by mouth daily at 6 (six) AM.,  Disp: 30 tablet, Rfl: 0   loratadine (CLARITIN) 10 MG tablet, Take 10 mg by mouth daily. (Patient not taking: Reported on 06/09/2022), Disp: , Rfl:    mirtazapine (REMERON) 7.5 MG tablet, Take 1 tablet (7.5 mg total) by mouth at bedtime. (Patient not taking: Reported on 04/12/2022), Disp: 30 tablet, Rfl: 0 Social History: Reviewed -  reports that she has been smoking cigarettes. She has been smoking an average of 1 pack per day. She has never used smokeless tobacco.  Review of Systems:   Constitutional: Negative for fever and chills Eyes: Negative for visual disturbances Respiratory: Negative for shortness of breath, dyspnea Cardiovascular: Negative for chest pain or palpitations  Gastrointestinal: Negative for vomiting, diarrhea and constipation; no abdominal pain Genitourinary: Negative for dysuria and urgency, vaginal irritation or itching Musculoskeletal: Negative for back pain, joint pain, myalgias  Neurological: Negative for dizziness and headaches    Objective Findings:    Physical Examination: Vitals:   06/09/22 1426  BP: 125/75  Pulse: 75   General appearance - well appearing, and in no distress Mental status - alert, oriented to person, place, and time Chest:  Normal respiratory effort Heart - normal rate and regular rhythm Abdomen:  Soft, nontender Pelvic: SSE:  small amount of white DC, wet prep few yeast only  no clue/trich/wbc Musculoskeletal:  Normal range of motion without pain Extremities:  No edema    No results found for this or any previous visit (from the past 24 hour(s)).    Assessment & Plan:  A:   Probable UTI--offered treatement or wait on cx, pt prefers Abx P:  Hylafem X 3 days Duricef BID X7 Rx for diflucan (in case gets another infection)   No follow-ups on file.  Christin Fudge CNM 06/09/2022 2:50 PM

## 2022-07-06 ENCOUNTER — Other Ambulatory Visit (HOSPITAL_COMMUNITY): Payer: Self-pay | Admitting: Adult Health

## 2022-07-06 DIAGNOSIS — M5459 Other low back pain: Secondary | ICD-10-CM

## 2022-07-18 ENCOUNTER — Ambulatory Visit (HOSPITAL_COMMUNITY)
Admission: RE | Admit: 2022-07-18 | Discharge: 2022-07-18 | Disposition: A | Discharge status: Home / Self Care | Payer: Medicare Other | Source: Ambulatory Visit | Attending: Adult Health | Admitting: Adult Health

## 2022-07-18 DIAGNOSIS — M5459 Other low back pain: Secondary | ICD-10-CM | POA: Insufficient documentation

## 2022-08-08 DIAGNOSIS — E876 Hypokalemia: Secondary | ICD-10-CM | POA: Insufficient documentation

## 2022-08-08 DIAGNOSIS — M545 Low back pain, unspecified: Secondary | ICD-10-CM | POA: Insufficient documentation

## 2022-11-11 ENCOUNTER — Other Ambulatory Visit (HOSPITAL_COMMUNITY): Payer: Self-pay | Admitting: Family Medicine

## 2022-11-11 DIAGNOSIS — N10 Acute tubulo-interstitial nephritis: Secondary | ICD-10-CM

## 2023-03-09 ENCOUNTER — Other Ambulatory Visit: Payer: Self-pay | Admitting: Internal Medicine

## 2023-03-11 ENCOUNTER — Other Ambulatory Visit: Payer: Self-pay | Admitting: Internal Medicine

## 2023-03-25 ENCOUNTER — Emergency Department (HOSPITAL_COMMUNITY): Payer: Medicare Other

## 2023-03-25 ENCOUNTER — Encounter (HOSPITAL_COMMUNITY): Payer: Self-pay | Admitting: *Deleted

## 2023-03-25 ENCOUNTER — Emergency Department (HOSPITAL_COMMUNITY)
Admission: EM | Admit: 2023-03-25 | Discharge: 2023-03-25 | Disposition: A | Discharge status: Home / Self Care | Payer: Medicare Other | Attending: Emergency Medicine | Admitting: Emergency Medicine

## 2023-03-25 ENCOUNTER — Other Ambulatory Visit: Payer: Self-pay

## 2023-03-25 DIAGNOSIS — J449 Chronic obstructive pulmonary disease, unspecified: Secondary | ICD-10-CM | POA: Insufficient documentation

## 2023-03-25 DIAGNOSIS — F17210 Nicotine dependence, cigarettes, uncomplicated: Secondary | ICD-10-CM | POA: Insufficient documentation

## 2023-03-25 DIAGNOSIS — M542 Cervicalgia: Secondary | ICD-10-CM

## 2023-03-25 DIAGNOSIS — I10 Essential (primary) hypertension: Secondary | ICD-10-CM | POA: Insufficient documentation

## 2023-03-25 DIAGNOSIS — E039 Hypothyroidism, unspecified: Secondary | ICD-10-CM | POA: Diagnosis not present

## 2023-03-25 DIAGNOSIS — E119 Type 2 diabetes mellitus without complications: Secondary | ICD-10-CM | POA: Diagnosis not present

## 2023-03-25 DIAGNOSIS — Z7951 Long term (current) use of inhaled steroids: Secondary | ICD-10-CM | POA: Insufficient documentation

## 2023-03-25 DIAGNOSIS — Z7984 Long term (current) use of oral hypoglycemic drugs: Secondary | ICD-10-CM | POA: Insufficient documentation

## 2023-03-25 DIAGNOSIS — R519 Headache, unspecified: Secondary | ICD-10-CM | POA: Diagnosis not present

## 2023-03-25 DIAGNOSIS — Z79899 Other long term (current) drug therapy: Secondary | ICD-10-CM | POA: Diagnosis not present

## 2023-03-25 DIAGNOSIS — I2510 Atherosclerotic heart disease of native coronary artery without angina pectoris: Secondary | ICD-10-CM | POA: Diagnosis not present

## 2023-03-25 LAB — CBC WITH DIFFERENTIAL/PLATELET
Abs Immature Granulocytes: 0.02 10*3/uL (ref 0.00–0.07)
Basophils Absolute: 0.1 10*3/uL (ref 0.0–0.1)
Basophils Relative: 1 %
Eosinophils Absolute: 0.1 10*3/uL (ref 0.0–0.5)
Eosinophils Relative: 2 %
HCT: 35.4 % — ABNORMAL LOW (ref 36.0–46.0)
Hemoglobin: 12.1 g/dL (ref 12.0–15.0)
Immature Granulocytes: 0 %
Lymphocytes Relative: 27 %
Lymphs Abs: 1.8 10*3/uL (ref 0.7–4.0)
MCH: 31.4 pg (ref 26.0–34.0)
MCHC: 34.2 g/dL (ref 30.0–36.0)
MCV: 91.9 fL (ref 80.0–100.0)
Monocytes Absolute: 0.5 10*3/uL (ref 0.1–1.0)
Monocytes Relative: 8 %
Neutro Abs: 4.3 10*3/uL (ref 1.7–7.7)
Neutrophils Relative %: 62 %
Platelets: 247 10*3/uL (ref 150–400)
RBC: 3.85 MIL/uL — ABNORMAL LOW (ref 3.87–5.11)
RDW: 12.7 % (ref 11.5–15.5)
WBC: 6.8 10*3/uL (ref 4.0–10.5)
nRBC: 0 % (ref 0.0–0.2)

## 2023-03-25 LAB — COMPREHENSIVE METABOLIC PANEL
ALT: 24 U/L (ref 0–44)
AST: 23 U/L (ref 15–41)
Albumin: 3.8 g/dL (ref 3.5–5.0)
Alkaline Phosphatase: 47 U/L (ref 38–126)
Anion gap: 10 (ref 5–15)
BUN: 17 mg/dL (ref 8–23)
CO2: 24 mmol/L (ref 22–32)
Calcium: 9.2 mg/dL (ref 8.9–10.3)
Chloride: 104 mmol/L (ref 98–111)
Creatinine, Ser: 0.85 mg/dL (ref 0.44–1.00)
GFR, Estimated: >60 mL/min (ref >60)
Glucose, Bld: 87 mg/dL (ref 70–99)
Potassium: 3.5 mmol/L (ref 3.5–5.1)
Sodium: 138 mmol/L (ref 135–145)
Total Bilirubin: 0.8 mg/dL (ref 0.0–1.2)
Total Protein: 6.6 g/dL (ref 6.5–8.1)

## 2023-03-25 MED ORDER — METHOCARBAMOL 500 MG PO TABS: 500.0000 mg | ORAL_TABLET | Freq: Three times a day (TID) | ORAL | 0 refills | Status: DC | PRN

## 2023-03-25 MED ORDER — IOHEXOL 300 MG/ML  SOLN
75.0000 mL | Freq: Once | INTRAMUSCULAR | Status: AC | PRN
  Administered 2023-03-25: 75 mL via INTRAVENOUS

## 2023-03-25 NOTE — Discharge Instructions (Addendum)
 We evaluated you for your neck pain.  Your CT scans did not show any dangerous findings. Please take Tylenol  and Motrin  for your symptoms at home.  You can take 1000 mg of Tylenol  every 6 hours and 600 mg of ibuprofen  every 6 hours as needed for your symptoms.  You can take these medicines together as needed, either at the same time, or alternating every 3 hours.  I have also prescribed you a small amount of a muscle relaxer.  This can make you drowsy so do not drive while taking this medication and do not mix with alcohol.  Please be sure to follow-up closely with your primary doctor.  If you have any new or worsening symptoms such as worsening pain, fevers, lightheadedness or dizziness, fainting, or any other concerning symptoms, please return to the emergency department.  We did notice that you had some very tiny lung nodules.  Since you smoke, you are at a higher risk of lung cancer, so to be safe, we recommend having a repeat CT scan in 1 year to make sure that none of them are growing.

## 2023-03-25 NOTE — ED Triage Notes (Signed)
 Pt with neck pain- first started in her rt ear about 1 month ago, moved to her neck and then moved to left neck.   Pt states every morning with swelling on bilateral sides x 2 weeks. Worse with movement.

## 2023-03-25 NOTE — ED Provider Notes (Signed)
 [place] [facility] AT [facility] Provider Note  CSN: [id number] Arrival date & time: 03/25/23 1439  Chief Complaint(s) Neck Pain  HPI [name] is a 68 y.o. female history of COPD, diabetes, hypertension presenting to the emergency department with swelling.  Patient reports swelling to her left neck.  She also reports that she might of had some swelling to the right neck earlier.  Has been present for around 2 weeks.  She reports weight loss, night sweats.  Around 7 pounds recently.  No shortness of breath or chest pain.  No sore throat.  She also reports new headaches over the past 2 weeks.  No abdominal pain, diarrhea, shortness of breath.   Past Medical History Past Medical History:  Diagnosis Date   COPD (chronic obstructive pulmonary disease) (HCC)    Coronary artery disease    Depression    Diabetes mellitus without complication (HCC)    GERD (gastroesophageal reflux disease)    History of syphilis    Hypertension    Hypothyroidism    PONV (postoperative nausea and vomiting)    Seizures (HCC)    Thyroid disease    Patient Active Problem List   Diagnosis Date Noted   Severe recurrent major depression without psychotic features (HCC) 07/21/2021   Borderline personality disorder (HCC) 07/21/2021   PTSD (post-traumatic stress disorder) 07/21/2021   Urge incontinence 05/26/2021   Vaginal discharge 12/16/2020   Vaginal atrophy 07/09/2020   Vaginal itching 07/09/2020   History of peptic ulcer 01/22/2020   Constipation 01/22/2020   LLQ abdominal pain 01/22/2020   Encounter for screening fecal occult blood testing 12/24/2019   Encounter for gynecological examination with Papanicolaou smear of cervix 12/24/2019   Routine cervical smear 12/24/2019   Smoker 12/24/2019   Bipolar disorder with severe depression (HCC) 08/09/2015   Home Medication(s) Prior to Admission medications   Medication Sig Start Date End Date Taking? Authorizing Provider   methocarbamol  (ROBAXIN ) 500 MG tablet Take 1 tablet (500 mg total) by mouth every 8 (eight) hours as needed for muscle spasms. 03/25/23  Yes Francesca Elsie CROME, MD  ACCU-CHEK GUIDE test strip USE STRIP TO CHECK GLUCOSE ONCE DAILY 09/06/21   [provider]  Accu-Chek Softclix Lancets lancets daily. 09/06/21   [provider]  acetaminophen  (TYLENOL ) 500 MG tablet Take 500 mg by mouth every 6 (six) hours as needed for moderate pain or headache.    [provider]  albuterol (VENTOLIN HFA) 108 (90 Base) MCG/ACT inhaler Inhale 2 puffs into the lungs every 6 (six) hours as needed for shortness of breath. Patient not taking: Reported on 06/09/2022 01/15/20   [provider]  Ascorbic Acid (VITAMIN C PO) Take 1 tablet by mouth daily.    [provider]  Blood Glucose Monitoring Suppl (ACCU-CHEK GUIDE) w/Device KIT daily. as directed 11/13/21   [provider]  calcium  carbonate (TUMS EX) 750 MG chewable tablet Chew 1,500 mg by mouth daily as needed for heartburn. Patient not taking: Reported on 06/09/2022    [provider]  cefadroxil  (DURICEF) 500 MG capsule Take 1 capsule (500 mg total) by mouth 2 (two) times daily. 06/09/22   Cresenzo-Dishmon, Cathlean, CNM  DULoxetine  (CYMBALTA ) 30 MG capsule Take 1 capsule (30 mg total) by mouth 2 (two) times daily. 07/23/21 05/02/22  Massengill, Rankin, MD  DULoxetine  (CYMBALTA ) 30 MG capsule Take 30 mg by mouth daily.    [provider]  famotidine  (PEPCID ) 20 MG tablet Take 1 tablet (20  mg total) by mouth 3 (three) times daily. Patient taking differently: Take 20 mg by mouth 3 (three) times daily as needed for heartburn. 02/17/22 02/17/23  Cindie Carlin POUR, DO  fluconazole  (DIFLUCAN ) 150 MG tablet 1 po stat; repeat in 3 days 06/09/22   Cresenzo-Dishmon, Cathlean, CNM  gabapentin  (NEURONTIN ) 100 MG capsule Take 1 capsule (100 mg total) by mouth 3 (three) times daily. 07/23/21 05/02/22  Massengill, Rankin,  MD  gabapentin  (NEURONTIN ) 100 MG capsule Take 100 mg by mouth 3 (three) times daily.    [provider]  levothyroxine  (SYNTHROID ) 75 MCG tablet Take 1 tablet (75 mcg total) by mouth daily at 6 (six) AM. 07/24/21 05/02/22  Massengill, Rankin, MD  levothyroxine  (SYNTHROID ) 75 MCG tablet Take 75 mcg by mouth daily before breakfast.    [provider]  lisinopril -hydrochlorothiazide  (PRINZIDE ,ZESTORETIC ) 10-12.5 MG tablet Take 1 tablet by mouth daily. Resume as in outpatient 08/11/15   Secundino Cones, NP  loratadine (CLARITIN) 10 MG tablet Take 10 mg by mouth daily. Patient not taking: Reported on 06/09/2022 02/08/22   [provider]  mirtazapine  (REMERON ) 7.5 MG tablet Take 1 tablet (7.5 mg total) by mouth at bedtime. Patient not taking: Reported on 04/12/2022 07/23/21 04/12/22  Johny Rankin, MD  Multiple Vitamins-Minerals (HAIR SKIN AND NAILS FORMULA) TABS Take 1 tablet by mouth 2 (two) times daily.    [provider]  Olopatadine HCl (PATADAY OP) Place 1 drop into both eyes daily as needed (allergies).    [provider]  potassium chloride  (MICRO-K ) 10 MEQ CR capsule Take 1 capsule by mouth daily. 01/15/20   [provider]  rosuvastatin (CRESTOR) 20 MG tablet Take 20 mg by mouth daily. 09/12/21   [provider]                                                                                                                                    Past Surgical History Past Surgical History:  Procedure Laterality Date   APPENDECTOMY     COLONOSCOPY WITH PROPOFOL  N/A 05/02/2022   Procedure: COLONOSCOPY WITH PROPOFOL ;  Surgeon: Cindie Carlin POUR, DO;  Location: [facility];  Service: Endoscopy;  Laterality: N/A;  215pm, asa 3   HEMOSTASIS CLIP PLACEMENT  05/02/2022   Procedure: HEMOSTASIS CLIP PLACEMENT;  Surgeon: Cindie Carlin POUR, DO;  Location: [facility];  Service: Endoscopy;;   POLYPECTOMY  05/02/2022   Procedure: POLYPECTOMY;   Surgeon: Cindie Carlin POUR, DO;  Location: [facility];  Service: Endoscopy;;   SUBMUCOSAL LIFTING INJECTION  05/02/2022   Procedure: SUBMUCOSAL LIFTING INJECTION;  Surgeon: Cindie Carlin POUR, DO;  Location: [facility];  Service: Endoscopy;;   TUBAL LIGATION     Family History Family History  Problem Relation Age of Onset   Hypertension Father    Diabetes Mother    Asthma Son    Depression Maternal Uncle     Social History  Social History   Tobacco Use   Smoking status: Every Day    Current packs/day: 1.00    Types: Cigarettes   Smokeless tobacco: Never  Vaping Use   Vaping status: Never Used  Substance Use Topics   Alcohol use: No   Drug use: No   Allergies Carisoprodol, Bee venom, Pamelor  [nortriptyline  hcl], Seroquel [quetiapine fumarate], Ambien [zolpidem tartrate], Buspar [buspirone], Trazodone and nefazodone, Wellbutrin [bupropion], and Zoloft [sertraline hcl]  Review of Systems Review of Systems  All other systems reviewed and are negative.   Physical Exam Vital Signs  I have reviewed the triage vital signs BP 132/75 (BP Location: Right Arm)   Pulse 86   Temp 98.9 F (37.2 C) (Oral)   Resp 19   Ht 5' (1.524 m)   Wt 55.5 kg   SpO2 96%   BMI 23.90 kg/m  Physical Exam Vitals and nursing note reviewed.  Constitutional:      General: She is not in acute distress.    Appearance: She is well-developed.  HENT:     Head: Normocephalic and atraumatic.     Mouth/Throat:     Mouth: Mucous membranes are moist.  Eyes:     Pupils: Pupils are equal, round, and reactive to light.  Neck:     Comments: Possible left supraclavicular lymphadenopathy Cardiovascular:     Rate and Rhythm: Normal rate and regular rhythm.     Heart sounds: No murmur heard. Pulmonary:     Effort: Pulmonary effort is normal. No respiratory distress.     Breath sounds: Normal breath sounds.  Abdominal:     General: Abdomen is flat.     Palpations: Abdomen is soft.     Tenderness:  There is no abdominal tenderness.  Musculoskeletal:        General: No tenderness.     Cervical back: Neck supple.     Right lower leg: No edema.     Left lower leg: No edema.  Skin:    General: Skin is warm and dry.  Neurological:     General: No focal deficit present.     Mental Status: She is alert. Mental status is at baseline.  Psychiatric:        Mood and Affect: Mood normal.        Behavior: Behavior normal.     ED Results and Treatments Labs (all labs ordered are listed, but only abnormal results are displayed) Labs Reviewed  CBC WITH DIFFERENTIAL/PLATELET - Abnormal; Notable for the following components:      Result Value   RBC 3.85 (*)    HCT 35.4 (*)    All other components within normal limits  COMPREHENSIVE METABOLIC PANEL                                                                                                                          Radiology CT Soft Tissue Neck W Contrast Result Date: 03/25/2023 CLINICAL DATA:  Soft tissue infection suspected,  neck, xray done Occult malignancy EXAM: CT NECK WITH CONTRAST TECHNIQUE: Multidetector CT imaging of the neck was performed using the standard protocol following the bolus administration of intravenous contrast. RADIATION DOSE REDUCTION: This exam was performed according to the departmental dose-optimization program which includes automated exposure control, adjustment of the mA and/or kV according to patient size and/or use of iterative reconstruction technique. CONTRAST:  75mL OMNIPAQUE  IOHEXOL  300 MG/ML  SOLN COMPARISON:  None Available. FINDINGS: Pharynx and larynx: Bilateral tonsils are normal in appearance. The epiglottis is normal in appearance. There is no retropharyngeal effusion. Assessment of the vocal folds is limited due to coaptation. Salivary glands: No inflammation, mass, or stone. Thyroid: Normal. Lymph nodes: None enlarged or abnormal density. Vascular: Negative. Limited intracranial: Negative. Visualized  orbits: Negative. Mastoids and visualized paranasal sinuses: No middle ear or mastoid effusion. Paranasal sinuses are clear. Orbits are unremarkable. Skeleton: Straightening of the normal cervical lordosis. Upper chest: Somewhat irregular 6 mm solid pulmonary nodule in the right upper lobe with central lucency (series 4, image 83). Recommend further evaluation with a dedicated chest CT. Other: None. IMPRESSION: 1. No acute abnormality in the neck. No CT etiology for neck pain identified. 2. Somewhat irregular 6 mm solid pulmonary nodule in the right upper lobe with central lucency. Recommend further evaluation with a dedicated chest CT. Electronically Signed   By: Lyndall Gore M.D.   On: 03/25/2023 18:30   CT Chest W Contrast Result Date: 03/25/2023 CLINICAL DATA:  Pain, swelling, mass. EXAM: CT CHEST WITH CONTRAST TECHNIQUE: Multidetector CT imaging of the chest was performed during intravenous contrast administration. RADIATION DOSE REDUCTION: This exam was performed according to the departmental dose-optimization program which includes automated exposure control, adjustment of the mA and/or kV according to patient size and/or use of iterative reconstruction technique. CONTRAST:  75mL OMNIPAQUE  IOHEXOL  300 MG/ML  SOLN COMPARISON:  X-ray 09/07/2020. FINDINGS: Cardiovascular: The thoracic aorta has a normal course and caliber with partially calcified plaque. Calcifications along the aortic valve. Heart is nonenlarged. Trace pericardial fluid. Coronary artery calcifications are seen. Mediastinum/Nodes: Slightly heterogeneous thyroid gland, nonspecific. Slightly patulous thoracic esophagus. No specific abnormal lymph node enlargement identified in the axillary region, hilum or mediastinum. There are some small nodes in the mediastinum which are less than a cm in short axis nonpathologic by size criteria. Lungs/Pleura: Breathing motion. No consolidation, pneumothorax or effusion. There is some subtle bronchiectasis  along the inferior aspect of the lingula. Few tiny lung nodules are identified. These include posterior right upper lobe medially which is semi-solid on series 4, image 27 measuring 4 mm, 3 mm in the middle lobe on series 4, image 83 amongst others that are sub 5 mm. Nonspecific. Upper Abdomen: Adrenal glands show mild thickening, nonspecific. Bosniak 1 upper pole right-sided renal cysts. There are 2 areas of enhancement identified in the liver. Example posteriorly on the right measures 19 mm on series 3, image 139 and focus more in segment 4 measuring 8 mm on image 141. Favor these being hemangiomas. Musculoskeletal: Mild degenerative changes of the spine. Small sclerotic focus along the posterior aspect of the T12 vertebral body, likely a bone island. No definite chest Guse mass identified along the upper left hemithorax. Please see separate dictation of head and neck CT. Critical Value/emergent results were called by telephone at the time of interpretation on 03/25/2023 at 6:15 pm to provider ELSIE BODY , who verbally acknowledged these results. IMPRESSION: Few scattered sub 5 mm lung nodules. Some of these are semi-solid. No  follow-up needed if patient is low-risk (and has no known or suspected primary neoplasm). Non-contrast chest CT can be considered in 12 months if patient is high-risk. This recommendation follows the consensus statement: Guidelines for Management of Incidental Pulmonary Nodules Detected on CT Images: From the Fleischner Society 2017; Radiology 2017; 284:228-243. Aortic Atherosclerosis (ICD10-I70.0). Electronically Signed   By: Ranell Bring M.D.   On: 03/25/2023 18:16   CT Head Wo Contrast Result Date: 03/25/2023 CLINICAL DATA:  Headache EXAM: CT HEAD WITHOUT CONTRAST TECHNIQUE: Contiguous axial images were obtained from the base of the skull through the vertex without intravenous contrast. RADIATION DOSE REDUCTION: This exam was performed according to the departmental dose-optimization  program which includes automated exposure control, adjustment of the mA and/or kV according to patient size and/or use of iterative reconstruction technique. COMPARISON:  Head CT 03/09/2021 FINDINGS: Brain: No evidence of acute infarction, hemorrhage, hydrocephalus, extra-axial collection or mass lesion/mass effect. Vascular: Atherosclerotic calcifications are present within the cavernous internal carotid arteries. Skull: Normal. Negative for fracture or focal lesion. Sinuses/Orbits: No acute finding. Other: None. IMPRESSION: No acute intracranial process. Electronically Signed   By: Greig Pique M.D.   On: 03/25/2023 18:12    Pertinent labs & imaging results that were available during my care of the patient were reviewed by me and considered in my medical decision making (see MDM for details).  Medications Ordered in ED Medications  iohexol  (OMNIPAQUE ) 300 MG/ML solution 75 mL (75 mLs Intravenous Contrast Given 03/25/23 1731)                                                                                                                                     Procedures Procedures  (including critical care time)  Medical Decision Making / ED Course   MDM:  67 year old female presenting to the emergency department with neck pain, swelling.  Patient overall well-appearing.  Physical exam with questionable left supraclavicular lymphadenopathy.  Given history of weight loss, night sweats, some concern for an occult malignancy.  Will obtain CT imaging to further evaluate for any lymphadenopathy or other acute process, will obtain basic labs.  She does have mild headaches will obtain CT head given age.  Low concern for acute infectious process.  Doubt meningitis.  Will reassess.  Clinical Course as of 03/25/23 1853  Sat Mar 25, 2023  1851 Labs are reassuring.  No leukocytosis.  Patient currently feeling well.  CT scans obtained with no evidence of acute process, she has some very small lung  nodules, is high risk given history of cancers and radiology recommends repeat CT scan in 1 year, this was discussed with the patient.  No evidence of any lymphadenopathy or other occult malignancy.  On my interpretation of CT scan area of swelling appears to correlate with slightly enlarged jugular vein.  Patient feels well currently.  Recommended close follow-up with primary care doctor given weight loss and night sweats  and strict return precaution for any new or worsening symptoms such as severe pain, high fevers, neck stiffness, or any other concerning symptoms. Will discharge patient to home. All questions answered. Patient comfortable with plan of discharge. Return precautions discussed with patient and specified on the after visit summary.  [WS]    Clinical Course User Index [WS] Francesca Elsie CROME, MD    Lab Tests: -I ordered, reviewed, and interpreted labs.   The pertinent results include:   Labs Reviewed  CBC WITH DIFFERENTIAL/PLATELET - Abnormal; Notable for the following components:      Result Value   RBC 3.85 (*)    HCT 35.4 (*)    All other components within normal limits  COMPREHENSIVE METABOLIC PANEL    Notable for no leukocytosis, no AKI    Imaging Studies ordered: I ordered imaging studies including CT scans  On my interpretation imaging demonstrates no acute process I independently visualized and interpreted imaging. I agree with the radiologist interpretation   Medicines ordered and prescription drug management: Meds ordered this encounter  Medications   iohexol  (OMNIPAQUE ) 300 MG/ML solution 75 mL   methocarbamol  (ROBAXIN ) 500 MG tablet    Sig: Take 1 tablet (500 mg total) by mouth every 8 (eight) hours as needed for muscle spasms.    Dispense:  20 tablet    Refill:  0    -I have reviewed the patients home medicines and have made adjustments as needed  Social Determinants of Health:  Diagnosis or treatment significantly limited by social determinants of  health: current smoker   Reevaluation: After the interventions noted above, I reevaluated the patient and found that their symptoms have improved  Co morbidities that complicate the patient evaluation  Past Medical History:  Diagnosis Date   COPD (chronic obstructive pulmonary disease) (HCC)    Coronary artery disease    Depression    Diabetes mellitus without complication (HCC)    GERD (gastroesophageal reflux disease)    History of syphilis    Hypertension    Hypothyroidism    PONV (postoperative nausea and vomiting)    Seizures (HCC)    Thyroid disease       Dispostion: Disposition decision including need for hospitalization was considered, and patient discharged from emergency department.    Final Clinical Impression(s) / ED Diagnoses Final diagnoses:  Neck pain     This chart was dictated using voice recognition software.  Despite best efforts to proofread,  errors can occur which can change the documentation meaning.    Francesca Elsie CROME, MD 03/25/23 [phone]

## 2023-04-17 ENCOUNTER — Encounter: Payer: Self-pay | Admitting: Gastroenterology

## 2023-04-17 ENCOUNTER — Ambulatory Visit: Payer: Medicare Other | Admitting: Gastroenterology

## 2023-04-17 VITALS — BP 130/68 | HR 81 | Temp 98.2°F | Ht 60.0 in | Wt 125.0 lb

## 2023-04-17 DIAGNOSIS — K219 Gastro-esophageal reflux disease without esophagitis: Secondary | ICD-10-CM | POA: Diagnosis not present

## 2023-04-17 DIAGNOSIS — K769 Liver disease, unspecified: Secondary | ICD-10-CM | POA: Insufficient documentation

## 2023-04-17 NOTE — Patient Instructions (Signed)
MRI liver to be scheduled.  Continue pepcid (famotidine) as before for acid reflux. We will consider upper endoscopy to evaluate your esophagus after upcoming MRI.

## 2023-04-17 NOTE — Progress Notes (Signed)
GI Office Note    Referring Provider: Katherine Basset* Primary Care Physician:  Kara Pacer, NP  Primary Gastroenterologist: Hennie Duos. Marletta Lor, DO   Chief Complaint   Chief Complaint  Patient presents with   Gastroesophageal Reflux    History of Present Illness   [name] is a 68 y.o. female presenting today at the request of PCP for GERD, rule out GI malignancy. Patient last seen in 2023. Previously reported remote PUD by EGD. H/o chronic constipation. H/o chronic GERD.  Patient states she has had a lot going on with her. States she has had swelling in the supraclavicular region bilaterally that is intermittent. She has had swelling around her neck and intermittent sore throat. Was seen in the ED recently with these complaints as well as night sweats and weight loss.  She has several studies done as outlined below including CT chest/neck/head.  Famotidine 2-3 daily controls reflux. She is not sure why PCP referred her to [place] as her reflux seems to be doing fine. "They think I may have cancer." She denies dysphagia. No abdominal pain. BMs regular. No melena, brbpr.     Wt Readings from Last 3 Encounters:  04/17/23 125 lb (56.7 kg)  03/25/23 122 lb 5.7 oz (55.5 kg)  06/09/22 118 lb (53.5 kg)    Colonoscopy 04/2022: -five 4 to 8 mm polyps in the descending colon and transverse colon removed -one 15mm polyp in the cecum removed with mucosal resection.  Clips placed. -Tubular adenomas/low-grade dysplasia and sessile serrated polyps, requiring 3-year surveillance colonoscopy  CT chest January 2025: IMPRESSION: Few scattered sub 5 mm lung nodules. Some of these are semi-solid. No follow-up needed if patient is low-risk (and has no known or suspected primary neoplasm). Non-contrast chest CT can be considered in 12 months if patient is high-risk. This recommendation follows the consensus statement: Guidelines for Management of Incidental Pulmonary  Nodules Detected on CT Images: From the Fleischner Society 2017; Radiology 2017; 284:228-243.  ADDENDUM: Addendum to the impression:  Two enhancing hepatic lesions favoring hemangiomas. However there is a differential. Please correlate with any known history or prior examination or dedicated workup such as MRI when clinically Appropriate  CT neck 03/2023: IMPRESSION: 1. No acute abnormality in the neck. No CT etiology for neck pain identified. 2. Somewhat irregular 6 mm solid pulmonary nodule in the right upper lobe with central lucency. Recommend further evaluation with a dedicated chest CT.   Medications   Current Outpatient Medications  Medication Sig Dispense Refill   ACCU-CHEK GUIDE test strip USE STRIP TO CHECK GLUCOSE ONCE DAILY     Accu-Chek Softclix Lancets lancets daily.     acetaminophen (TYLENOL) 500 MG tablet Take 500 mg by mouth every 6 (six) hours as needed for moderate pain or headache.     Ascorbic Acid (VITAMIN C PO) Take 1 tablet by mouth daily.     atorvastatin (LIPITOR) 20 MG tablet Take 20 mg by mouth at bedtime.     Blood Glucose Monitoring Suppl (ACCU-CHEK GUIDE) w/Device KIT daily. as directed     DULoxetine (CYMBALTA) 30 MG capsule Take 1 capsule (30 mg total) by mouth 2 (two) times daily. (Patient taking differently: Take 120 mg by mouth daily.) 60 capsule 0   famotidine (PEPCID) 20 MG tablet Take 1 tablet (20 mg total) by mouth 3 (three) times daily. (Patient taking differently: Take 20 mg by mouth 3 (three) times daily as needed for heartburn.) 270 tablet 3  gabapentin (NEURONTIN) 100 MG capsule Take 1 capsule (100 mg total) by mouth 3 (three) times daily. 90 capsule 0   hydrOXYzine (VISTARIL) 25 MG capsule Take 25 mg by mouth 3 (three) times daily.     levothyroxine (SYNTHROID) 75 MCG tablet Take 1 tablet (75 mcg total) by mouth daily at 6 (six) AM. 30 tablet 0   lisinopril-hydrochlorothiazide (PRINZIDE,ZESTORETIC) 10-12.5 MG tablet Take 1 tablet by  mouth daily. Resume as in outpatient 30 tablet 0   mirtazapine (REMERON) 7.5 MG tablet Take 1 tablet (7.5 mg total) by mouth at bedtime. 30 tablet 0   Multiple Vitamins-Minerals (HAIR SKIN AND NAILS FORMULA) TABS Take 1 tablet by mouth 2 (two) times daily.     Olopatadine HCl (PATADAY OP) Place 1 drop into both eyes daily as needed (allergies).     potassium chloride (MICRO-K) 10 MEQ CR capsule Take 1 capsule by mouth daily.     No current facility-administered medications for this visit.    Allergies   Allergies as of 04/17/2023 - Review Complete 04/17/2023  Allergen Reaction Noted   Carisoprodol Shortness Of Breath and Swelling 04/12/2022   Bee venom Nausea And Vomiting and Swelling 05/20/2014   Nortriptyline  04/17/2023   Pamelor [nortriptyline hcl]  08/09/2015   Quetiapine Swelling 04/17/2023   Seroquel [quetiapine fumarate] Swelling 11/28/2014   Ambien [zolpidem tartrate] Palpitations 08/09/2015   Buspar [buspirone] Palpitations 11/12/2012   Nefazodone Palpitations 04/17/2023   Trazodone Palpitations 04/17/2023   Trazodone and nefazodone Palpitations 08/09/2015   Wellbutrin [bupropion] Palpitations 11/12/2012   Zoloft [sertraline hcl] Palpitations 11/12/2012     Past Medical History   Past Medical History:  Diagnosis Date   COPD (chronic obstructive pulmonary disease) (HCC)    Coronary artery disease    Depression    Diabetes mellitus without complication (HCC)    GERD (gastroesophageal reflux disease)    History of syphilis    Hypertension    Hypothyroidism    PONV (postoperative nausea and vomiting)    Seizures (HCC)    Thyroid disease     Past Surgical History   Past Surgical History:  Procedure Laterality Date   APPENDECTOMY     COLONOSCOPY WITH PROPOFOL N/A 05/02/2022   Procedure: COLONOSCOPY WITH PROPOFOL;  Surgeon: Lanelle Bal, DO;  Location: [facility];  Service: Endoscopy;  Laterality: N/A;  215pm, asa 3   HEMOSTASIS CLIP PLACEMENT  05/02/2022    Procedure: HEMOSTASIS CLIP PLACEMENT;  Surgeon: Lanelle Bal, DO;  Location: [facility];  Service: Endoscopy;;   POLYPECTOMY  05/02/2022   Procedure: POLYPECTOMY;  Surgeon: Lanelle Bal, DO;  Location: [facility];  Service: Endoscopy;;   SUBMUCOSAL LIFTING INJECTION  05/02/2022   Procedure: SUBMUCOSAL LIFTING INJECTION;  Surgeon: Lanelle Bal, DO;  Location: [facility];  Service: Endoscopy;;   TUBAL LIGATION      Past Family History   Family History  Problem Relation Age of Onset   Hypertension Father    Diabetes Mother    Asthma Son    Depression Maternal Uncle     Past Social History   Social History   Socioeconomic History   Marital status: Widowed    Spouse name: Not on file   Number of children: Not on file   Years of education: Not on file   Highest education level: Not on file  Occupational History   Not on file  Tobacco Use   Smoking status: Every Day    Current packs/day:  1.00    Types: Cigarettes   Smokeless tobacco: Never  Vaping Use   Vaping status: Never Used  Substance and Sexual Activity   Alcohol use: No   Drug use: No   Sexual activity: Not Currently    Birth control/protection: Surgical, Post-menopausal    Comment: tubal  Other Topics Concern   Not on file  Social History Narrative   Not on file   Social Drivers of Health   Financial Resource Strain: Medium Risk (12/24/2019)   Overall Financial Resource Strain (CARDIA)    Difficulty of Paying Living Expenses: Somewhat hard  Food Insecurity: Food Insecurity Present (12/24/2019)   Hunger Vital Sign    Worried About Running Out of Food in the Last Year: Sometimes true    Ran Out of Food in the Last Year: Sometimes true  Transportation Needs: No Transportation Needs (12/24/2019)   PRAPARE - Administrator, Civil Service (Medical): No    Lack of Transportation (Non-Medical): No  Physical Activity: Inactive (12/24/2019)   Exercise Vital Sign    Days of Exercise  per Week: 0 days    Minutes of Exercise per Session: 0 min  Stress: No Stress Concern Present (12/24/2019)   [employer] of Occupational Health - Occupational Stress Questionnaire    Feeling of Stress : Only a little  Social Connections: Socially Isolated (12/24/2019)   Social Connection and Isolation Panel [NHANES]    Frequency of Communication with Friends and Family: Once a week    Frequency of Social Gatherings with Friends and Family: Never    Attends Religious Services: More than 4 times per year    Active Member of [employer] or Organizations: No    Attends Banker Meetings: Never    Marital Status: Widowed  Intimate Partner Violence: Not At Risk (12/24/2019)   Humiliation, Afraid, Rape, and Kick questionnaire    Fear of Current or Ex-Partner: No    Emotionally Abused: No    Physically Abused: No    Sexually Abused: No    Review of Systems   General: Negative for anorexia, weight loss, fever, chills, fatigue, weakness. ENT: Negative for hoarseness, difficulty swallowing , nasal congestion.see hpi CV: Negative for chest pain, angina, palpitations, dyspnea on exertion, peripheral edema.  Respiratory: Negative for dyspnea at rest, dyspnea on exertion, cough, sputum, wheezing.  GI: See history of present illness. GU:  Negative for dysuria, hematuria, urinary incontinence, urinary frequency, nocturnal urination.  Endo: Negative for unusual weight change.     Physical Exam   BP 130/68 (BP Location: Right Arm, Patient Position: Sitting, Cuff Size: Normal)   Pulse 81   Temp 98.2 F (36.8 C) (Oral)   Ht 5' (1.524 m)   Wt 125 lb (56.7 kg)   SpO2 96%   BMI 24.41 kg/m    General: Well-nourished, well-developed in no acute distress.  Eyes: No icterus. Mouth: Oropharyngeal mucosa moist and pink   Lungs: Clear to auscultation bilaterally.  Heart: Regular rate and rhythm, no murmurs rubs or gallops.  Abdomen: Bowel sounds are normal, nontender, nondistended, no  hepatosplenomegaly or masses,  no abdominal bruits or hernia , no rebound or guarding.  Rectal: not performed  Extremities: No lower extremity edema. No clubbing or deformities. Neuro: Alert and oriented x 4   Skin: Warm and dry, no jaundice.   Psych: Alert and cooperative, normal mood and affect.  Labs   Lab Results  Component Value Date   NA 138 03/25/2023   CL 104  03/25/2023   K 3.5 03/25/2023   CO2 24 03/25/2023   BUN 17 03/25/2023   CREATININE 0.85 03/25/2023   GFRNONAA >60 03/25/2023   CALCIUM 9.2 03/25/2023   PHOS 3.4 11/29/2006   ALBUMIN 3.8 03/25/2023   GLUCOSE 87 03/25/2023   Lab Results  Component Value Date   WBC 6.8 03/25/2023   HGB 12.1 03/25/2023   HCT 35.4 (L) 03/25/2023   MCV 91.9 03/25/2023   PLT 247 03/25/2023   Lab Results  Component Value Date   ALT 24 03/25/2023   AST 23 03/25/2023   ALKPHOS 47 03/25/2023   BILITOT 0.8 03/25/2023    Imaging Studies   CT Chest W Contrast Addendum Date: 03/25/2023 ADDENDUM REPORT: 03/25/2023 20:46 ADDENDUM: Addendum to the impression: Two enhancing hepatic lesions favoring hemangiomas. However there is a differential. Please correlate with any known history or prior examination or dedicated workup such as MRI when clinically appropriate Electronically Signed   By: Karen Kays M.D.   On: 03/25/2023 20:46   Result Date: 03/25/2023 CLINICAL DATA:  Pain, swelling, mass. EXAM: CT CHEST WITH CONTRAST TECHNIQUE: Multidetector CT imaging of the chest was performed during intravenous contrast administration. RADIATION DOSE REDUCTION: This exam was performed according to the departmental dose-optimization program which includes automated exposure control, adjustment of the mA and/or kV according to patient size and/or use of iterative reconstruction technique. CONTRAST:  75mL OMNIPAQUE IOHEXOL 300 MG/ML  SOLN COMPARISON:  X-ray 09/07/2020. FINDINGS: Cardiovascular: The thoracic aorta has a normal course and caliber with partially  calcified plaque. Calcifications along the aortic valve. Heart is nonenlarged. Trace pericardial fluid. Coronary artery calcifications are seen. Mediastinum/Nodes: Slightly heterogeneous thyroid gland, nonspecific. Slightly patulous thoracic esophagus. No specific abnormal lymph node enlargement identified in the axillary region, hilum or mediastinum. There are some small nodes in the mediastinum which are less than a cm in short axis nonpathologic by size criteria. Lungs/Pleura: Breathing motion. No consolidation, pneumothorax or effusion. There is some subtle bronchiectasis along the inferior aspect of the lingula. Few tiny lung nodules are identified. These include posterior right upper lobe medially which is semi-solid on series 4, image 27 measuring 4 mm, 3 mm in the middle lobe on series 4, image 83 amongst others that are sub 5 mm. Nonspecific. Upper Abdomen: Adrenal glands show mild thickening, nonspecific. Bosniak 1 upper pole right-sided renal cysts. There are 2 areas of enhancement identified in the liver. Example posteriorly on the right measures 19 mm on series 3, image 139 and focus more in segment 4 measuring 8 mm on image 141. Favor these being hemangiomas. Musculoskeletal: Mild degenerative changes of the spine. Small sclerotic focus along the posterior aspect of the T12 vertebral body, likely a bone island. No definite chest Sar mass identified along the upper left hemithorax. Please see separate dictation of head and neck CT. Critical Value/emergent results were called by telephone at the time of interpretation on 03/25/2023 at 6:15 pm to provider Alvino Blood , who verbally acknowledged these results. IMPRESSION: Few scattered sub 5 mm lung nodules. Some of these are semi-solid. No follow-up needed if patient is low-risk (and has no known or suspected primary neoplasm). Non-contrast chest CT can be considered in 12 months if patient is high-risk. This recommendation follows the consensus  statement: Guidelines for Management of Incidental Pulmonary Nodules Detected on CT Images: From the Fleischner Society 2017; Radiology 2017; 284:228-243. Aortic Atherosclerosis (ICD10-I70.0). Electronically Signed: By: Karen Kays M.D. On: 03/25/2023 18:16   CT Soft Tissue Neck  W Contrast Result Date: 03/25/2023 CLINICAL DATA:  Soft tissue infection suspected, neck, xray done Occult malignancy EXAM: CT NECK WITH CONTRAST TECHNIQUE: Multidetector CT imaging of the neck was performed using the standard protocol following the bolus administration of intravenous contrast. RADIATION DOSE REDUCTION: This exam was performed according to the departmental dose-optimization program which includes automated exposure control, adjustment of the mA and/or kV according to patient size and/or use of iterative reconstruction technique. CONTRAST:  75mL OMNIPAQUE IOHEXOL 300 MG/ML  SOLN COMPARISON:  None Available. FINDINGS: Pharynx and larynx: Bilateral tonsils are normal in appearance. The epiglottis is normal in appearance. There is no retropharyngeal effusion. Assessment of the vocal folds is limited due to coaptation. Salivary glands: No inflammation, mass, or stone. Thyroid: Normal. Lymph nodes: None enlarged or abnormal density. Vascular: Negative. Limited intracranial: Negative. Visualized orbits: Negative. Mastoids and visualized paranasal sinuses: No middle ear or mastoid effusion. Paranasal sinuses are clear. Orbits are unremarkable. Skeleton: Straightening of the normal cervical lordosis. Upper chest: Somewhat irregular 6 mm solid pulmonary nodule in the right upper lobe with central lucency (series 4, image 83). Recommend further evaluation with a dedicated chest CT. Other: None. IMPRESSION: 1. No acute abnormality in the neck. No CT etiology for neck pain identified. 2. Somewhat irregular 6 mm solid pulmonary nodule in the right upper lobe with central lucency. Recommend further evaluation with a dedicated chest CT.  Electronically Signed   By: Lorenza Cambridge M.D.   On: 03/25/2023 18:30   CT Head Wo Contrast Result Date: 03/25/2023 CLINICAL DATA:  Headache EXAM: CT HEAD WITHOUT CONTRAST TECHNIQUE: Contiguous axial images were obtained from the base of the skull through the vertex without intravenous contrast. RADIATION DOSE REDUCTION: This exam was performed according to the departmental dose-optimization program which includes automated exposure control, adjustment of the mA and/or kV according to patient size and/or use of iterative reconstruction technique. COMPARISON:  Head CT 03/09/2021 FINDINGS: Brain: No evidence of acute infarction, hemorrhage, hydrocephalus, extra-axial collection or mass lesion/mass effect. Vascular: Atherosclerotic calcifications are present within the cavernous internal carotid arteries. Skull: Normal. Negative for fracture or focal lesion. Sinuses/Orbits: No acute finding. Other: None. IMPRESSION: No acute intracranial process. Electronically Signed   By: Darliss Cheney M.D.   On: 03/25/2023 18:12    Assessment/Plan:   GERD: -symptoms controlled with famotidine 20mg  2-3 times daily -reportedly remote EGD years ago with PUD, would consider updated EGD for Barrett's screening  Liver lesions: -two lesions notes on chest CT, recommending MRI liver for evaluation  Colon cancer screening: Colonoscopy 04/2022 completed, due 3 years surveillance in 04/2025   Leanna Battles. Melvyn Neth, MHS, PA-C [facility]

## 2023-04-25 ENCOUNTER — Ambulatory Visit (HOSPITAL_COMMUNITY): Admission: RE | Admit: 2023-04-25 | Payer: Medicare Other | Source: Ambulatory Visit

## 2023-05-05 ENCOUNTER — Ambulatory Visit (HOSPITAL_COMMUNITY)
Admission: RE | Admit: 2023-05-05 | Discharge: 2023-05-05 | Disposition: A | Discharge status: Home / Self Care | Payer: Medicare Other | Source: Ambulatory Visit | Attending: Gastroenterology | Admitting: Gastroenterology

## 2023-05-05 DIAGNOSIS — K769 Liver disease, unspecified: Secondary | ICD-10-CM | POA: Diagnosis present

## 2023-05-05 DIAGNOSIS — K219 Gastro-esophageal reflux disease without esophagitis: Secondary | ICD-10-CM | POA: Diagnosis present

## 2023-05-05 MED ORDER — GADOBUTROL 1 MMOL/ML IV SOLN
5.6000 mL | Freq: Once | INTRAVENOUS | Status: AC | PRN
  Administered 2023-05-05: 5.6 mL via INTRAVENOUS

## 2023-05-13 NOTE — Progress Notes (Unsigned)
 [name], female    DOB: 04/20/1955    MRN: [id number]   Brief patient profile:  34  yowf  active smoker  referred to pulmonary clinic in [place]  05/15/2023 by  Tillie Fantasia NP for abnormal CT chest in setting of neck pain x around tgiving  2025 better with heat application/ worse with twisting in either direction assoc with gen HA but neg ct head and neck  03/25/2023      History of Present Illness  05/15/2023  Pulmonary/ 1st office eval/ [facility] / [facility] ACEi  Chief Complaint  Patient presents with   Consult  Dyspnea:  denies limited by doe  Cough: x 3 months sporadic mostly non productive  Sleep: on back fine due to neck pain  SABA use: none  02: none  Clariton for sinus pain = on both sides     No obvious day to day or daytime pattern/variability or assoc excess/ purulent sputum or mucus plugs  or cp or chest tightness, subjective wheeze or overt sinus or hb symptoms.    Also denies any obvious fluctuation of symptoms with weather or environmental changes or other aggravating or alleviating factors except as outlined above   No unusual exposure hx or h/o childhood pna/ asthma or knowledge of premature birth.  Current Allergies, Complete Past Medical History, Past Surgical History, Family History, and Social History were reviewed in [employer] record.  ROS  The following are not active complaints unless bolded Hoarseness, sore throat, dysphagia, dental problems, itching, sneezing,  nasal congestion or discharge of excess mucus or purulent secretions, ear ache,   fever, chills, sweats, unintended wt loss or wt gain, classically pleuritic or exertional cp,  orthopnea pnd or arm/hand swelling  or leg swelling, presyncope, palpitations, abdominal pain, anorexia, nausea, vomiting, diarrhea  or change in bowel habits or change in bladder habits, change in stools or change in urine, dysuria, hematuria,  rash, arthralgias, visual complaints,  headache, numbness, weakness or ataxia or problems with walking or coordination,  change in mood or  memory.            Outpatient Medications Prior to Visit  Medication Sig Dispense Refill   ACCU-CHEK GUIDE test strip USE STRIP TO CHECK GLUCOSE ONCE DAILY     Accu-Chek Softclix Lancets lancets daily.     acetaminophen (TYLENOL) 500 MG tablet Take 500 mg by mouth every 6 (six) hours as needed for moderate pain or headache.     Ascorbic Acid (VITAMIN C PO) Take 1 tablet by mouth daily.     atorvastatin (LIPITOR) 20 MG tablet Take 20 mg by mouth at bedtime.     Blood Glucose Monitoring Suppl (ACCU-CHEK GUIDE) w/Device KIT daily. as directed     hydrOXYzine (VISTARIL) 25 MG capsule Take 25 mg by mouth 3 (three) times daily.     lisinopril-hydrochlorothiazide (PRINZIDE,ZESTORETIC) 10-12.5 MG tablet Take 1 tablet by mouth daily. Resume as in outpatient 30 tablet 0   Multiple Vitamins-Minerals (HAIR SKIN AND NAILS FORMULA) TABS Take 1 tablet by mouth 2 (two) times daily.     Olopatadine HCl (PATADAY OP) Place 1 drop into both eyes daily as needed (allergies).     potassium chloride (MICRO-K) 10 MEQ CR capsule Take 1 capsule by mouth daily.     DULoxetine (CYMBALTA) 30 MG capsule Take 1 capsule (30 mg total) by mouth 2 (two) times daily. (Patient taking differently: Take 120 mg by mouth daily.) 60 capsule 0  famotidine (PEPCID) 20 MG tablet Take 1 tablet (20 mg total) by mouth 3 (three) times daily. (Patient taking differently: Take 20 mg by mouth 3 (three) times daily as needed for heartburn.) 270 tablet 3   gabapentin (NEURONTIN) 100 MG capsule Take 1 capsule (100 mg total) by mouth 3 (three) times daily. 90 capsule 0   levothyroxine (SYNTHROID) 75 MCG tablet Take 1 tablet (75 mcg total) by mouth daily at 6 (six) AM. 30 tablet 0   mirtazapine (REMERON) 7.5 MG tablet Take 1 tablet (7.5 mg total) by mouth at bedtime. 30 tablet 0   No facility-administered medications prior to visit.    Past  Medical History:  Diagnosis Date   COPD (chronic obstructive pulmonary disease) (HCC)    Coronary artery disease    Depression    Diabetes mellitus without complication (HCC)    GERD (gastroesophageal reflux disease)    History of syphilis    Hypertension    Hypothyroidism    PONV (postoperative nausea and vomiting)    Seizures (HCC)    Thyroid disease       Objective:     BP 126/74   Pulse 90   Ht 5' (1.524 m)   Wt 129 lb (58.5 kg)   BMI 25.19 kg/m   Vital signs reviewed  05/15/2023  - Note at rest 02 sats  97% on RA   General appearance:    amb wf nad   HEENT : Oropharynx  clear   Nasal turbinates nl    NECK :  without  apparent JVD/ palpable Nodes/TM    LUNGS: no acc muscle use,  Min barrel  contour chest Hogenson with bilateral  slightly decreased bs s audible wheeze and  without cough on insp or exp maneuvers and min  Hyperresonant  to  percussion bilaterally    CV:  RRR  no s3 or murmur or increase in P2, and no edema   ABD:  soft and nontender with pos end  insp Hoover's  in the supine position.  No bruits or organomegaly appreciated   MS:  Nl gait/ ext warm without deformities Or obvious joint restrictions  calf tenderness, cyanosis or clubbing     SKIN: warm and dry without lesions    NEURO:  alert, approp, nl sensorium with  no motor or cerebellar deficits apparent.          CXR PA and Lateral:   05/15/2023 :    I personally reviewed images and impression is as follows:     Mild  hyperinflation/ non specific increase in markings, no def nodules      Assessment   Essential hypertension D/c ACEi  05/15/2023   ACE inhibitors are problematic in  pts with airway complaints because  even experienced pulmonologists can't always distinguish ace effects from copd/asthma.  By themselves they don't actually cause a problem, much like oxygen can't by itself start a fire, but they certainly serve as a powerful catalyst or enhancer for any "fire"  or inflammatory  process in the upper airway, be it caused by an ET  tube or more commonly reflux (especially in the obese or pts with known GERD or who are on biphoshonates).    In the era of ARB near equivalency until we have a better handle on the reversibility of the airway problem, it just makes sense to avoid ACEI  entirely in the short run.  >>> try valsartan 80-12.5 one daily   Multiple lung nodules on CT  Active smoker with mpnd 03/25/23 ct along with mild bronchiectatic changes  - Quant TB  05/15/2023  - ESR  05/15/2023 - Allergy screen 05/15/2023 >  Eos 0. 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   In any case, CT results reviewed with pt >>> Too small for PET or bx, not suspicious enough for excisional bx > really only option for now is follow the Fleischner society guidelines as rec by radiology =  6-12 month f/u CT   Note these findings have nothing to do with her neck symptoms which are positional > referred to ortho.  Discussed in detail all the  indications, usual  risks and alternatives  relative to the benefits with patient who agrees to proceed with w/u as outlined.             Each maintenance medication was reviewed in detail including emphasizing most importantly the difference between maintenance and prns and under what circumstances the prns are to be triggered  using an action plan format where appropriate.  Total time for H and P, chart review, counseling,   and generating customized AVS unique to this office visit / same day charting = 45 min new pt eval          Cigarette smoker 4-5 min discussion re active cigarette smoking in addition to office E&M  Ask about tobacco use:   ongoing  Advise quitting  I took an extended  opportunity with this patient to outline the consequences of continued cigarette use  in airway disorders based on all the data we have from the multiple national lung health studies (perfomed over decades at millions of dollars in cost)  indicating that smoking cessation, not choice of inhalers or pulmonary physicians, is the most important aspect of her  care.   Assess willingness:  Not committed at this point Assist in quit attempt:  Per PCP when ready Arrange follow up:   Follow up per Primary Care planned       F/u pft's for baseline ordered.      Sandrea Hughs, MD 05/15/2023

## 2023-05-15 ENCOUNTER — Encounter: Payer: Self-pay | Admitting: Internal Medicine

## 2023-05-15 ENCOUNTER — Ambulatory Visit (HOSPITAL_COMMUNITY)
Admission: RE | Admit: 2023-05-15 | Discharge: 2023-05-15 | Disposition: A | Discharge status: Home / Self Care | Payer: Medicare Other | Source: Ambulatory Visit | Attending: Internal Medicine | Admitting: Internal Medicine

## 2023-05-15 ENCOUNTER — Ambulatory Visit: Payer: Medicare Other | Admitting: Internal Medicine

## 2023-05-15 VITALS — BP 126/74 | HR 90 | Ht 60.0 in | Wt 129.0 lb

## 2023-05-15 DIAGNOSIS — R918 Other nonspecific abnormal finding of lung field: Secondary | ICD-10-CM | POA: Diagnosis present

## 2023-05-15 DIAGNOSIS — F17210 Nicotine dependence, cigarettes, uncomplicated: Secondary | ICD-10-CM | POA: Diagnosis not present

## 2023-05-15 DIAGNOSIS — I10 Essential (primary) hypertension: Secondary | ICD-10-CM | POA: Insufficient documentation

## 2023-05-15 DIAGNOSIS — M542 Cervicalgia: Secondary | ICD-10-CM | POA: Diagnosis not present

## 2023-05-15 MED ORDER — VALSARTAN-HYDROCHLOROTHIAZIDE 80-12.5 MG PO TABS: 1.0000 | ORAL_TABLET | Freq: Every day | ORAL | 11 refills | Status: AC

## 2023-05-15 NOTE — Assessment & Plan Note (Addendum)
 Active smoker with mpnd 03/25/23 ct along with mild bronchiectatic changes  - Quant TB  05/15/2023  - ESR  05/15/2023 - Allergy screen 05/15/2023 >  Eos 0. 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   In any case, CT results reviewed with pt >>> Too small for PET or bx, not suspicious enough for excisional bx > really only option for now is follow the Fleischner society guidelines as rec by radiology =  6-12 month f/u CT   Note these findings have nothing to do with her neck symptoms which are positional > referred to ortho.  Discussed in detail all the  indications, usual  risks and alternatives  relative to the benefits with patient who agrees to proceed with w/u as outlined.             Each maintenance medication was reviewed in detail including emphasizing most importantly the difference between maintenance and prns and under what circumstances the prns are to be triggered using an action plan format where appropriate.  Total time for H and P, chart review, counseling,   and generating customized AVS unique to this office visit / same day charting = 45 min new pt eval

## 2023-05-15 NOTE — Assessment & Plan Note (Addendum)
 4-5 min discussion re active cigarette smoking in addition to office E&M  Ask about tobacco use:   ongoing  Advise quitting  I took an extended  opportunity with this patient to outline the consequences of continued cigarette use  in airway disorders based on all the data we have from the multiple national lung health studies (perfomed over decades at millions of dollars in cost)  indicating that smoking cessation, not choice of inhalers or pulmonary physicians, is the most important aspect of her  care.   Assess willingness:  Not committed at this point Assist in quit attempt:  Per PCP when ready Arrange follow up:   Follow up per Primary Care planned       F/u pft's for baseline ordered.

## 2023-05-15 NOTE — Assessment & Plan Note (Signed)
 D/c ACEi  05/15/2023   ACE inhibitors are problematic in  pts with airway complaints because  even experienced pulmonologists can't always distinguish ace effects from copd/asthma.  By themselves they don't actually cause a problem, much like oxygen can't by itself start a fire, but they certainly serve as a powerful catalyst or enhancer for any "fire"  or inflammatory process in the upper airway, be it caused by an ET  tube or more commonly reflux (especially in the obese or pts with known GERD or who are on biphoshonates).    In the era of ARB near equivalency until we have a better handle on the reversibility of the airway problem, it just makes sense to avoid ACEI  entirely in the short run.  >>> try valsartan 80-12.5 one daily

## 2023-05-15 NOTE — Patient Instructions (Addendum)
 The key is to stop smoking completely before smoking completely stops you!   My office will be contacting you by phone for referral to [facility]  for PFT and [facility] referral  - if you don't hear back from my office within one week please call us back or notify us thru MyChart and we'll address it right away.    Please remember to go to the lab department   for your tests - we will call you with the results when they are available.  .Please remember to go to the  x-ray department  @  [facility] for your tests - we will call you with the results when they are available          Please schedule a follow up visit in 3 months but call sooner if needed   Add  stop lisinopril and replace with valsartan 80-12.5 one daily

## 2023-05-16 LAB — RHEUMATOID FACTOR: Rheumatoid fact SerPl-aCnc: 10.3 [IU]/mL (ref <14.0)

## 2023-05-17 ENCOUNTER — Telehealth: Payer: Self-pay | Admitting: Internal Medicine

## 2023-05-17 NOTE — Telephone Encounter (Signed)
 Spoke with patient regarding the Thursday 06/22/23 2:00pm PFT at [facility] time is 1:45 pm--1st floor registration desk---will mail information to patient and she voiced her understanding

## 2023-05-18 LAB — QUANTIFERON-TB GOLD PLUS
QuantiFERON Mitogen Value: >10 [IU]/mL
QuantiFERON Nil Value: 0.02 [IU]/mL
QuantiFERON TB1 Ag Value: 0.39 [IU]/mL
QuantiFERON TB2 Ag Value: 0.47 [IU]/mL
QuantiFERON-TB Gold Plus: POSITIVE — AB

## 2023-05-18 LAB — CBC WITH DIFFERENTIAL/PLATELET
Basophils Absolute: 0.1 10*3/uL (ref 0.0–0.2)
Basos: 1 %
EOS (ABSOLUTE): 0.2 10*3/uL (ref 0.0–0.4)
Eos: 3 %
Hematocrit: 36.8 % (ref 34.0–46.6)
Hemoglobin: 12.2 g/dL (ref 11.1–15.9)
Immature Grans (Abs): 0 10*3/uL (ref 0.0–0.1)
Immature Granulocytes: 0 %
Lymphocytes Absolute: 2.1 10*3/uL (ref 0.7–3.1)
Lymphs: 27 %
MCH: 31.2 pg (ref 26.6–33.0)
MCHC: 33.2 g/dL (ref 31.5–35.7)
MCV: 94 fL (ref 79–97)
Monocytes Absolute: 0.6 10*3/uL (ref 0.1–0.9)
Monocytes: 7 %
Neutrophils Absolute: 4.8 10*3/uL (ref 1.4–7.0)
Neutrophils: 62 %
Platelets: 253 10*3/uL (ref 150–450)
RBC: 3.91 x10E6/uL (ref 3.77–5.28)
RDW: 12.9 % (ref 11.7–15.4)
WBC: 7.8 10*3/uL (ref 3.4–10.8)

## 2023-05-18 LAB — SEDIMENTATION RATE: Sed Rate: 20 mm/h (ref 0–40)

## 2023-05-18 LAB — ALPHA-1-ANTITRYPSIN PHENOTYP: A-1 Antitrypsin: 168 mg/dL (ref 101–187)

## 2023-05-24 ENCOUNTER — Telehealth: Payer: Self-pay

## 2023-05-24 NOTE — Telephone Encounter (Signed)
 PT ret Tarsha's call. Please try again. [phone]

## 2023-05-24 NOTE — Telephone Encounter (Signed)
-----   Message from Sandrea Hughs sent at 05/23/2023  6:34 PM EST ----- Call pt:  Reviewed cxr and no acute change so no change in recommendations made at [facility]

## 2023-05-24 NOTE — Telephone Encounter (Signed)
 I called and spoke to pt. Pt informed of cxr results and verbalized understandings. NFN

## 2023-05-24 NOTE — Telephone Encounter (Signed)
 I left a message for the patient to return my call.

## 2023-05-25 ENCOUNTER — Ambulatory Visit: Payer: Medicare Other | Admitting: Orthopedic Surgery

## 2023-05-26 ENCOUNTER — Telehealth: Payer: Self-pay | Admitting: Internal Medicine

## 2023-05-26 NOTE — Telephone Encounter (Signed)
 Patient is returning missed call from [place]. Extension of last encounter .

## 2023-05-26 NOTE — Telephone Encounter (Signed)
 Nyoka Cowden, MD Regarding result: DG Chest 2 View     05/23/23  6:34 PM Result Note Call pt:  Reviewed cxr and no acute change so no change in recommendations made at ov  Pt is aware of results and voiced her understanding.  Nothing further needed.

## 2023-05-30 ENCOUNTER — Other Ambulatory Visit (HOSPITAL_COMMUNITY): Payer: Self-pay | Admitting: Adult Health

## 2023-05-30 DIAGNOSIS — Z13820 Encounter for screening for osteoporosis: Secondary | ICD-10-CM

## 2023-06-12 ENCOUNTER — Other Ambulatory Visit: Payer: Self-pay | Admitting: *Deleted

## 2023-06-12 DIAGNOSIS — R9389 Abnormal findings on diagnostic imaging of other specified body structures: Secondary | ICD-10-CM

## 2023-06-19 ENCOUNTER — Ambulatory Visit (HOSPITAL_COMMUNITY)
Admission: RE | Admit: 2023-06-19 | Discharge: 2023-06-19 | Disposition: A | Discharge status: Home / Self Care | Source: Ambulatory Visit | Attending: Gastroenterology | Admitting: Gastroenterology

## 2023-06-19 DIAGNOSIS — R9389 Abnormal findings on diagnostic imaging of other specified body structures: Secondary | ICD-10-CM | POA: Insufficient documentation

## 2023-06-19 MED ORDER — GADOBUTROL 1 MMOL/ML IV SOLN
6.0000 mL | Freq: Once | INTRAVENOUS | Status: AC | PRN
  Administered 2023-06-19: 6 mL via INTRAVENOUS

## 2023-06-22 ENCOUNTER — Ambulatory Visit (HOSPITAL_COMMUNITY): Admission: RE | Admit: 2023-06-22 | Payer: Medicare Other | Source: Ambulatory Visit

## 2023-06-25 ENCOUNTER — Emergency Department (HOSPITAL_COMMUNITY)
Admission: EM | Admit: 2023-06-25 | Discharge: 2023-06-25 | Disposition: A | Discharge status: Home / Self Care | Attending: Emergency Medicine | Admitting: Emergency Medicine

## 2023-06-25 ENCOUNTER — Other Ambulatory Visit: Payer: Self-pay

## 2023-06-25 ENCOUNTER — Emergency Department (HOSPITAL_COMMUNITY)

## 2023-06-25 DIAGNOSIS — Z79899 Other long term (current) drug therapy: Secondary | ICD-10-CM | POA: Diagnosis not present

## 2023-06-25 DIAGNOSIS — E861 Hypovolemia: Secondary | ICD-10-CM | POA: Diagnosis not present

## 2023-06-25 DIAGNOSIS — R9431 Abnormal electrocardiogram [ECG] [EKG]: Secondary | ICD-10-CM | POA: Insufficient documentation

## 2023-06-25 DIAGNOSIS — R55 Syncope and collapse: Secondary | ICD-10-CM | POA: Insufficient documentation

## 2023-06-25 DIAGNOSIS — E876 Hypokalemia: Secondary | ICD-10-CM | POA: Insufficient documentation

## 2023-06-25 LAB — CBC
HCT: 33.3 % — ABNORMAL LOW (ref 36.0–46.0)
Hemoglobin: 11.4 g/dL — ABNORMAL LOW (ref 12.0–15.0)
MCH: 31.8 pg (ref 26.0–34.0)
MCHC: 34.2 g/dL (ref 30.0–36.0)
MCV: 93 fL (ref 80.0–100.0)
Platelets: 248 10*3/uL (ref 150–400)
RBC: 3.58 MIL/uL — ABNORMAL LOW (ref 3.87–5.11)
RDW: 13.2 % (ref 11.5–15.5)
WBC: 7.9 10*3/uL (ref 4.0–10.5)
nRBC: 0 % (ref 0.0–0.2)

## 2023-06-25 LAB — RAPID URINE DRUG SCREEN, HOSP PERFORMED
Amphetamines: NOT DETECTED
Barbiturates: NOT DETECTED
Benzodiazepines: NOT DETECTED
Cocaine: NOT DETECTED
Opiates: POSITIVE — AB
Tetrahydrocannabinol: POSITIVE — AB

## 2023-06-25 LAB — LACTIC ACID, PLASMA: Lactic Acid, Venous: 1.1 mmol/L (ref 0.5–1.9)

## 2023-06-25 LAB — URINALYSIS, ROUTINE W REFLEX MICROSCOPIC
Bilirubin Urine: NEGATIVE
Glucose, UA: NEGATIVE mg/dL
Hgb urine dipstick: NEGATIVE
Ketones, ur: NEGATIVE mg/dL
Leukocytes,Ua: NEGATIVE
Nitrite: NEGATIVE
Protein, ur: NEGATIVE mg/dL
Specific Gravity, Urine: 1.01 (ref 1.005–1.030)
pH: 6 (ref 5.0–8.0)

## 2023-06-25 LAB — TROPONIN I (HIGH SENSITIVITY)
Troponin I (High Sensitivity): 4 ng/L (ref <18)
Troponin I (High Sensitivity): 5 ng/L (ref <18)

## 2023-06-25 LAB — COMPREHENSIVE METABOLIC PANEL WITH GFR
ALT: 20 U/L (ref 0–44)
AST: 24 U/L (ref 15–41)
Albumin: 3.7 g/dL (ref 3.5–5.0)
Alkaline Phosphatase: 45 U/L (ref 38–126)
Anion gap: 13 (ref 5–15)
BUN: 20 mg/dL (ref 8–23)
CO2: 24 mmol/L (ref 22–32)
Calcium: 9 mg/dL (ref 8.9–10.3)
Chloride: 103 mmol/L (ref 98–111)
Creatinine, Ser: 1.14 mg/dL — ABNORMAL HIGH (ref 0.44–1.00)
GFR, Estimated: 52 mL/min — ABNORMAL LOW (ref >60)
Glucose, Bld: 132 mg/dL — ABNORMAL HIGH (ref 70–99)
Potassium: 3.1 mmol/L — ABNORMAL LOW (ref 3.5–5.1)
Sodium: 140 mmol/L (ref 135–145)
Total Bilirubin: 0.6 mg/dL (ref 0.0–1.2)
Total Protein: 6.5 g/dL (ref 6.5–8.1)

## 2023-06-25 LAB — ETHANOL: Alcohol, Ethyl (B): <10 mg/dL (ref <10)

## 2023-06-25 LAB — MAGNESIUM: Magnesium: 2 mg/dL (ref 1.7–2.4)

## 2023-06-25 MED ORDER — POTASSIUM CHLORIDE 20 MEQ PO PACK
40.0000 meq | PACK | Freq: Once | ORAL | Status: AC
  Administered 2023-06-25: 40 meq via ORAL
  Filled 2023-06-25: qty 2

## 2023-06-25 MED ORDER — POTASSIUM CHLORIDE CRYS ER 20 MEQ PO TBCR: EXTENDED_RELEASE_TABLET | ORAL | 0 refills | Status: AC

## 2023-06-25 MED ORDER — LACTATED RINGERS IV BOLUS
1000.0000 mL | Freq: Once | INTRAVENOUS | Status: AC
  Administered 2023-06-25: 1000 mL via INTRAVENOUS

## 2023-06-25 NOTE — ED Triage Notes (Signed)
 Pt arrived via [facility] with complaints of syncopal episode at friends house. Pt states she took hydrocodone and other daily meds earlier.

## 2023-06-25 NOTE — Discharge Instructions (Signed)
 It was our pleasure to provide your ER care today - we hope that you feel better. Drink plenty of fluids/stay well hydrated.   From today's labs, your potassium level is low - eat plenty of fruits and vegetables, take potassium supplement as prescribed. and follow up with your doctor for recheck in 1-2 weeks.    Follow up closely with primary care doctor in the next 2-3 days.  Return to ER if worse, new symptoms, fevers, new/severe pain, chest pain, trouble breathing, severe abdominal pain, weak/fainting, or other concern.

## 2023-06-25 NOTE — ED Notes (Signed)
 Pt unable to provide urine at this time. Will reassess after bolus complete.

## 2023-06-25 NOTE — ED Provider Notes (Signed)
  EMERGENCY DEPARTMENT AT [facility] Provider Note   CSN: [id number] Arrival date & time: 06/25/23  1505     History  Chief Complaint  Patient presents with   Near Syncope    [name] is a 68 y.o. female.  Pt with general weakness and near syncope. Was sitting outside, felt faint, slid down in chair, ?momentary loc. Bp noted to be soft, pt denies focal pain. No headache. No chest pain or discomfort. No sob or unusual doe. No abd pain or nvd. No dysuria or gu c/o. No extremity pain or swelling. No fever or chills. Denies blood loss, vaginal bleeding, rectal bleeding or melena. Denies change in meds. No fever or chills. Had eaten/drank relatively little today.   The history is provided by the patient, medical records and the EMS personnel. The history is limited by the condition of the patient.  Near Syncope Pertinent negatives include no chest pain, no abdominal pain, no headaches and no shortness of breath.       Home Medications Prior to Admission medications   Medication Sig Start Date End Date Taking? Authorizing Provider  ACCU-CHEK GUIDE test strip USE STRIP TO CHECK GLUCOSE ONCE DAILY 09/06/21   [provider]  Accu-Chek Softclix Lancets lancets daily. 09/06/21   [provider]  acetaminophen (TYLENOL) 500 MG tablet Take 500 mg by mouth every 6 (six) hours as needed for moderate pain or headache.    [provider]  Ascorbic Acid (VITAMIN C PO) Take 1 tablet by mouth daily.    [provider]  atorvastatin (LIPITOR) 20 MG tablet Take 20 mg by mouth at bedtime.    [provider]  Blood Glucose Monitoring Suppl (ACCU-CHEK GUIDE) w/Device KIT daily. as directed 11/13/21   [provider]  DULoxetine (CYMBALTA) 30 MG capsule Take 1 capsule (30 mg total) by mouth 2 (two) times daily. Patient taking differently: Take 120 mg by mouth daily. 07/23/21 04/17/23  Massengill, Harrold Donath, MD  famotidine (PEPCID) 20 MG  tablet Take 1 tablet (20 mg total) by mouth 3 (three) times daily. Patient taking differently: Take 20 mg by mouth 3 (three) times daily as needed for heartburn. 02/17/22 04/17/23  Lanelle Bal, DO  gabapentin (NEURONTIN) 100 MG capsule Take 1 capsule (100 mg total) by mouth 3 (three) times daily. 07/23/21 04/17/23  Massengill, Harrold Donath, MD  hydrOXYzine (VISTARIL) 25 MG capsule Take 25 mg by mouth 3 (three) times daily.    [provider]  levothyroxine (SYNTHROID) 75 MCG tablet Take 1 tablet (75 mcg total) by mouth daily at 6 (six) AM. 07/24/21 04/17/23  Massengill, Harrold Donath, MD  mirtazapine (REMERON) 7.5 MG tablet Take 1 tablet (7.5 mg total) by mouth at bedtime. 07/23/21 04/17/23  Massengill, Harrold Donath, MD  Multiple Vitamins-Minerals (HAIR SKIN AND NAILS FORMULA) TABS Take 1 tablet by mouth 2 (two) times daily.    [provider]  Olopatadine HCl (PATADAY OP) Place 1 drop into both eyes daily as needed (allergies).    [provider]  potassium chloride (MICRO-K) 10 MEQ CR capsule Take 1 capsule by mouth daily. 01/15/20   [provider]  valsartan-hydrochlorothiazide (DIOVAN HCT) 80-12.5 MG tablet Take 1 tablet by mouth daily. 05/15/23   Nyoka Cowden, MD      Allergies    Carisoprodol, Bee venom, Nortriptyline, Pamelor [nortriptyline hcl], Quetiapine, Seroquel [quetiapine fumarate], Ambien [zolpidem tartrate], Buspar [buspirone], Nefazodone, Trazodone, Trazodone and nefazodone, Wellbutrin [bupropion], and Zoloft [sertraline hcl]  Review of Systems   Review of Systems  Constitutional:  Negative for chills and fever.  HENT:  Negative for sore throat.   Eyes:  Negative for redness and visual disturbance.  Respiratory:  Negative for cough and shortness of breath.   Cardiovascular:  Positive for near-syncope. Negative for chest pain, palpitations and leg swelling.  Gastrointestinal:  Negative for abdominal pain, blood in stool, diarrhea and vomiting.  Genitourinary:   Negative for dysuria, flank pain and vaginal bleeding.  Musculoskeletal:  Negative for back pain and neck pain.  Skin:  Negative for rash.  Neurological:  Negative for speech difficulty, numbness and headaches.    Physical Exam Updated Vital Signs BP 130/64   Pulse 77   Temp 97.6 F (36.4 C) (Oral)   Resp 14   Ht 1.524 m (5')   Wt 55.3 kg   SpO2 100%   BMI 23.83 kg/m  Physical Exam Vitals and nursing note reviewed.  Constitutional:      Appearance: Normal appearance. She is well-developed.  HENT:     Head: Atraumatic.     Nose: Nose normal.     Mouth/Throat:     Mouth: Mucous membranes are moist.  Eyes:     General: No scleral icterus.    Conjunctiva/sclera: Conjunctivae normal.     Pupils: Pupils are equal, round, and reactive to light.  Neck:     Vascular: No carotid bruit.     Trachea: No tracheal deviation.  Cardiovascular:     Rate and Rhythm: Normal rate and regular rhythm.     Pulses: Normal pulses.     Heart sounds: Normal heart sounds. No murmur heard.    No friction rub. No gallop.  Pulmonary:     Effort: Pulmonary effort is normal. No respiratory distress.     Breath sounds: Normal breath sounds.  Abdominal:     General: Bowel sounds are normal. There is no distension.     Palpations: Abdomen is soft. There is no mass.     Tenderness: There is no abdominal tenderness. There is no guarding or rebound.     Hernia: No hernia is present.  Genitourinary:    Comments: No cva tenderness.  Musculoskeletal:        General: No swelling or tenderness.     Cervical back: Normal range of motion and neck supple. No rigidity or tenderness. No muscular tenderness.     Right lower leg: No edema.     Left lower leg: No edema.  Skin:    General: Skin is warm and dry.     Findings: No rash.  Neurological:     Mental Status: She is alert.     Comments: Alert, speech normal. Motor/sens grossly intact bil.   Psychiatric:        Mood and Affect: Mood normal.     ED  Results / Procedures / Treatments   Labs (all labs ordered are listed, but only abnormal results are displayed) Results for orders placed or performed during the hospital encounter of 06/25/23  CBC   Collection Time: 06/25/23  3:38 PM  Result Value Ref Range   WBC 7.9 4.0 - 10.5 K/uL   RBC 3.58 (L) 3.87 - 5.11 MIL/uL   Hemoglobin 11.4 (L) 12.0 - 15.0 g/dL   HCT 16.1 (L) 09.6 - 04.5 %   MCV 93.0 80.0 - 100.0 fL   MCH 31.8 26.0 - 34.0 pg   MCHC 34.2 30.0 - 36.0 g/dL   RDW 40.9  11.5 - 15.5 %   Platelets 248 150 - 400 K/uL   nRBC 0.0 0.0 - 0.2 %  Comprehensive metabolic panel with GFR   Collection Time: 06/25/23  3:38 PM  Result Value Ref Range   Sodium 140 135 - 145 mmol/L   Potassium 3.1 (L) 3.5 - 5.1 mmol/L   Chloride 103 98 - 111 mmol/L   CO2 24 22 - 32 mmol/L   Glucose, Bld 132 (H) 70 - 99 mg/dL   BUN 20 8 - 23 mg/dL   Creatinine, Ser 4.54 (H) 0.44 - 1.00 mg/dL   Calcium 9.0 8.9 - 09.8 mg/dL   Total Protein 6.5 6.5 - 8.1 g/dL   Albumin 3.7 3.5 - 5.0 g/dL   AST 24 15 - 41 U/L   ALT 20 0 - 44 U/L   Alkaline Phosphatase 45 38 - 126 U/L   Total Bilirubin 0.6 0.0 - 1.2 mg/dL   GFR, Estimated 52 (L) >60 mL/min   Anion gap 13 5 - 15  Ethanol   Collection Time: 06/25/23  3:38 PM  Result Value Ref Range   Alcohol, Ethyl (B) <10 <10 mg/dL  Lactic acid, plasma   Collection Time: 06/25/23  3:38 PM  Result Value Ref Range   Lactic Acid, Venous 1.1 0.5 - 1.9 mmol/L  Troponin I (High Sensitivity)   Collection Time: 06/25/23  3:38 PM  Result Value Ref Range   Troponin I (High Sensitivity) 4 <18 ng/L  Magnesium   Collection Time: 06/25/23  5:10 PM  Result Value Ref Range   Magnesium 2.0 1.7 - 2.4 mg/dL  Troponin I (High Sensitivity)   Collection Time: 06/25/23  5:51 PM  Result Value Ref Range   Troponin I (High Sensitivity) 5 <18 ng/L  Urinalysis, Routine w reflex microscopic -Urine, Clean Catch   Collection Time: 06/25/23  6:31 PM  Result Value Ref Range   Color, Urine  YELLOW YELLOW   APPearance CLEAR CLEAR   Specific Gravity, Urine 1.010 1.005 - 1.030   pH 6.0 5.0 - 8.0   Glucose, UA NEGATIVE NEGATIVE mg/dL   Hgb urine dipstick NEGATIVE NEGATIVE   Bilirubin Urine NEGATIVE NEGATIVE   Ketones, ur NEGATIVE NEGATIVE mg/dL   Protein, ur NEGATIVE NEGATIVE mg/dL   Nitrite NEGATIVE NEGATIVE   Leukocytes,Ua NEGATIVE NEGATIVE  Rapid urine drug screen (hospital performed)   Collection Time: 06/25/23  6:31 PM  Result Value Ref Range   Opiates POSITIVE (A) NONE DETECTED   Cocaine NONE DETECTED NONE DETECTED   Benzodiazepines NONE DETECTED NONE DETECTED   Amphetamines NONE DETECTED NONE DETECTED   Tetrahydrocannabinol POSITIVE (A) NONE DETECTED   Barbiturates NONE DETECTED NONE DETECTED     EKG EKG Interpretation Date/Time:  Sunday June 25 2023 15:16:58 EDT Ventricular Rate:  87 PR Interval:  145 QRS Duration:  100 QT Interval:  402 QTC Calculation: 484 R Axis:   54  Text Interpretation: Sinus rhythm Nonspecific T wave abnormality Confirmed by Cathren Laine ([id number]) on 06/25/2023 3:21:52 PM  Radiology DG Chest Port 1 View Result Date: 06/25/2023 CLINICAL DATA:  Weakness, syncope EXAM: PORTABLE CHEST - 1 VIEW COMPARISON:  05/15/2023 FINDINGS: Lungs are clear. Heart size and mediastinal contours are within normal limits. Aortic Atherosclerosis (ICD10-170.0). No effusion. Visualized bones unremarkable. IMPRESSION: No acute cardiopulmonary disease. Electronically Signed   By: Corlis Leak M.D.   On: 06/25/2023 16:16    Procedures Procedures    Medications Ordered in ED Medications  lactated ringers bolus 1,000 mL (0 mLs  Intravenous Stopped 06/25/23 1744)  lactated ringers bolus 1,000 mL (1,000 mLs Intravenous New Bag/Given 06/25/23 1740)  potassium chloride (KLOR-CON) packet 40 mEq (40 mEq Oral Given 06/25/23 1738)    ED Course/ Medical Decision Making/ A&P                                 Medical Decision Making Problems Addressed: Hypokalemia: acute  illness or injury Hypotension due to hypovolemia: acute illness or injury with systemic symptoms that poses a threat to life or bodily functions Near syncope: acute illness or injury with systemic symptoms that poses a threat to life or bodily functions Syncope and collapse: acute illness or injury with systemic symptoms that poses a threat to life or bodily functions  Amount and/or Complexity of Data Reviewed Independent Historian: EMS    Details: hx External Data Reviewed: notes. Labs: ordered. Decision-making details documented in ED Course. Radiology: ordered and independent interpretation performed. Decision-making details documented in ED Course. ECG/medicine tests: ordered and independent interpretation performed. Decision-making details documented in ED Course.  Risk Prescription drug management. Decision regarding hospitalization.   Iv ns. Continuous pulse ox and cardiac monitoring. Labs ordered/sent. Imaging ordered.   Differential diagnosis includes dehydration, anemia, fever/sepsis, etc. Dispo decision including potential need for admission considered - will get labs and imaging and reassess.   Reviewed nursing notes and prior charts for additional history. External reports reviewed. Additional history from: EMS.  LR bolus.   Cardiac monitor: sinus rhythm, rate 80.  Labs reviewed/interpreted by me - lactate normal. Trop normal. Wbc normal. Hct 33. Ua neg for uti. Trop x 2 normal and not increasing. No cp. Felt not c/w acs. Uds +thc/op.   Xrays reviewed/interpreted by me - no pna.   Additional fluids/po and iv. Bp improved. No faintness or dizziness.   Ambulatory about ED, no faintness. No pain. Vitals normal.   Pt appears stable for d/c.   Rec close pcp f/u.  Return precautions provided.    CRITICAL CARE RE: syncope with soft blood pressure/hypotension, q parenteral ivf resuscitation Performed by: Suzi Roots Total critical care time: 40 minutes Critical  care time was exclusive of separately billable procedures and treating other patients. Critical care was necessary to treat or prevent imminent or life-threatening deterioration. Critical care was time spent personally by me on the following activities: development of treatment plan with patient and/or surrogate as well as nursing, discussions with consultants, evaluation of patient's response to treatment, examination of patient, obtaining history from patient or surrogate, ordering and performing treatments and interventions, ordering and review of laboratory studies, ordering and review of radiographic studies, pulse oximetry and re-evaluation of patient's condition.         Final Clinical Impression(s) / ED Diagnoses Final diagnoses:  None    Rx / DC Orders ED Discharge Orders     None         Cathren Laine, MD 06/25/23 1943

## 2023-06-25 NOTE — ED Notes (Signed)
 Pt informed urine sample needed or we will have to use catheter. Pt states she will try to give sample when fluid is complete. Pt drinking fluids as well.

## 2023-07-18 ENCOUNTER — Other Ambulatory Visit: Payer: Self-pay | Admitting: *Deleted

## 2023-07-18 DIAGNOSIS — R9389 Abnormal findings on diagnostic imaging of other specified body structures: Secondary | ICD-10-CM

## 2023-08-15 ENCOUNTER — Other Ambulatory Visit (HOSPITAL_COMMUNITY)

## 2023-09-14 ENCOUNTER — Inpatient Hospital Stay (HOSPITAL_COMMUNITY): Admission: RE | Admit: 2023-09-14 | Source: Ambulatory Visit

## 2023-09-19 ENCOUNTER — Ambulatory Visit: Admitting: Gastroenterology

## 2023-09-19 NOTE — Progress Notes (Deleted)
 GI Office Note    Referring Provider: Benjamin Raina Sheets* Primary Care Physician:  Benjamin Raina Elizabeth, NP  Primary Gastroenterologist:  Chief Complaint   No chief complaint on file.   History of Present Illness   [name] is a 68 y.o. female presenting today for follow up. Last seen 03/2023. H/o GERD, constipation. Presents today for to disuss possible EGD for Barrett's screening.   H/o of two liver lesions noted on prior chest CT, MRI liver advised. See findings below. Referred her to neurosurgeon.    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 No aggressive liver lesions.   Bosniak 1 upper pole right-sided renal cyst.   Atypical enhancement in the space between the spinous process of the spine at L3-4 of uncertain etiology and significance. Please correlate with symptoms. Confirmatory lumbar spine MRI would be useful to confirm etiology when clinically appropriate   MRI lumbar 06/2023: IMPRESSION: 1. Mild canal stenosis at L3-L4 and L4-L5 bulging arthropathy 2. Moderate foraminal stenosis at L4-L5 and L5-S1.   Colonoscopy 04/2022: -five 4 to 8 mm polyps in the descending colon and transverse colon removed -one 15mm polyp in the cecum removed with mucosal resection.  Clips placed. -Tubular adenomas/low-grade dysplasia and sessile serrated polyps, requiring 3-year surveillance colonoscopy    Medications   Current Outpatient Medications  Medication Sig Dispense Refill   ACCU-CHEK GUIDE test strip USE STRIP TO CHECK GLUCOSE ONCE DAILY     Accu-Chek Softclix Lancets lancets daily.     acetaminophen  (TYLENOL ) 500 MG tablet Take 500 mg by mouth every 6 (six) hours as needed for moderate pain or headache.     Ascorbic Acid (VITAMIN C PO) Take 1 tablet by mouth daily.     atorvastatin  (LIPITOR ) 20 MG tablet Take 20 mg by mouth at bedtime.     Blood Glucose Monitoring  Suppl (ACCU-CHEK GUIDE) w/Device KIT daily. as directed     DULoxetine  (CYMBALTA ) 30 MG capsule Take 1 capsule (30 mg total) by mouth 2 (two) times daily. (Patient taking differently: Take 120 mg by mouth daily.) 60 capsule 0   famotidine  (PEPCID ) 20 MG tablet Take 1 tablet (20 mg total) by mouth 3 (three) times daily as needed for heartburn. 270 tablet 1   gabapentin  (NEURONTIN ) 100 MG capsule Take 1 capsule (100 mg total) by mouth 3 (three) times daily. 90 capsule 0   hydrOXYzine  (VISTARIL ) 25 MG capsule Take 25 mg by mouth 3 (three) times daily.     levothyroxine  (SYNTHROID ) 75 MCG tablet Take 1 tablet (75 mcg total) by mouth daily at 6 (six) AM. 30 tablet 0   mirtazapine  (REMERON ) 7.5 MG tablet Take 1 tablet (7.5 mg total) by mouth at bedtime. 30 tablet 0   Multiple Vitamins-Minerals (HAIR SKIN AND NAILS FORMULA) TABS Take 1 tablet by mouth 2 (two) times daily.     Olopatadine HCl (PATADAY OP) Place 1 drop into both eyes daily as needed (allergies).     potassium chloride  SA (KLOR-CON  M) 20 MEQ tablet One po bid x 3 days,  then one po once a day 15 tablet 0   valsartan -hydrochlorothiazide  (DIOVAN  HCT) 80-12.5 MG tablet Take 1 tablet by mouth daily. 30 tablet 11   No current facility-administered medications for this visit.    Allergies   Allergies as of 09/19/2023 - Review Complete 06/25/2023  Allergen Reaction Noted   Carisoprodol Shortness Of Breath and Swelling 04/12/2022  Bee venom Nausea And Vomiting and Swelling 05/20/2014   Nortriptyline   04/17/2023   Pamelor  [nortriptyline  hcl]  08/09/2015   Quetiapine Swelling 04/17/2023   Seroquel [quetiapine fumarate] Swelling 11/28/2014   Ambien [zolpidem tartrate] Palpitations 08/09/2015   Buspar [buspirone] Palpitations 11/12/2012   Nefazodone Palpitations 04/17/2023   Trazodone Palpitations 04/17/2023   Trazodone and nefazodone Palpitations 08/09/2015   Wellbutrin [bupropion] Palpitations 11/12/2012   Zoloft [sertraline hcl]  Palpitations 11/12/2012     Past Medical History   Past Medical History:  Diagnosis Date   COPD (chronic obstructive pulmonary disease) (HCC)    Coronary artery disease    Depression    Diabetes mellitus without complication (HCC)    GERD (gastroesophageal reflux disease)    History of syphilis    Hypertension    Hypothyroidism    PONV (postoperative nausea and vomiting)    Seizures (HCC)    Thyroid disease     Past Surgical History   Past Surgical History:  Procedure Laterality Date   APPENDECTOMY     COLONOSCOPY WITH PROPOFOL  N/A 05/02/2022   Procedure: COLONOSCOPY WITH PROPOFOL ;  Surgeon: Cindie Carlin POUR, DO;  Location: [facility];  Service: Endoscopy;  Laterality: N/A;  215pm, asa 3   HEMOSTASIS CLIP PLACEMENT  05/02/2022   Procedure: HEMOSTASIS CLIP PLACEMENT;  Surgeon: Cindie Carlin POUR, DO;  Location: [facility];  Service: Endoscopy;;   POLYPECTOMY  05/02/2022   Procedure: POLYPECTOMY;  Surgeon: Cindie Carlin POUR, DO;  Location: [facility];  Service: Endoscopy;;   SUBMUCOSAL LIFTING INJECTION  05/02/2022   Procedure: SUBMUCOSAL LIFTING INJECTION;  Surgeon: Cindie Carlin POUR, DO;  Location: [facility];  Service: Endoscopy;;   TUBAL LIGATION      Past Family History   Family History  Problem Relation Age of Onset   Hypertension Father    Diabetes Mother    Asthma Son    Depression Maternal Uncle     Past Social History   Social History   Socioeconomic History   Marital status: Widowed    Spouse name: Not on file   Number of children: Not on file   Years of education: Not on file   Highest education level: Not on file  Occupational History   Not on file  Tobacco Use   Smoking status: Every Day    Current packs/day: 1.00    Types: Cigarettes   Smokeless tobacco: Never  Vaping Use   Vaping status: Never Used  Substance and Sexual Activity   Alcohol use: No   Drug use: No   Sexual activity: Not Currently    Birth control/protection:  Surgical, Post-menopausal    Comment: tubal  Other Topics Concern   Not on file  Social History Narrative   Not on file   Social Drivers of Health   Financial Resource Strain: Medium Risk (12/24/2019)   Overall Financial Resource Strain (CARDIA)    Difficulty of Paying Living Expenses: Somewhat hard  Food Insecurity: Food Insecurity Present (12/24/2019)   Hunger Vital Sign    Worried About Running Out of Food in the Last Year: Sometimes true    Ran Out of Food in the Last Year: Sometimes true  Transportation Needs: No Transportation Needs (12/24/2019)   PRAPARE - Administrator, Civil Service (Medical): No    Lack of Transportation (Non-Medical): No  Physical Activity: Inactive (12/24/2019)   Exercise Vital Sign    Days of Exercise per Week: 0 days    Minutes of  Exercise per Session: 0 min  Stress: No Stress Concern Present (12/24/2019)   [employer] of Occupational Health - Occupational Stress Questionnaire    Feeling of Stress : Only a little  Social Connections: Socially Isolated (12/24/2019)   Social Connection and Isolation Panel    Frequency of Communication with Friends and Family: Once a week    Frequency of Social Gatherings with Friends and Family: Never    Attends Religious Services: More than 4 times per year    Active Member of [employer] or Organizations: No    Attends Banker Meetings: Never    Marital Status: Widowed  Intimate Partner Violence: Not At Risk (12/24/2019)   Humiliation, Afraid, Rape, and Kick questionnaire    Fear of Current or Ex-Partner: No    Emotionally Abused: No    Physically Abused: No    Sexually Abused: No    Review of Systems   General: Negative for anorexia, weight loss, fever, chills, fatigue, weakness. ENT: Negative for hoarseness, difficulty swallowing , nasal congestion. CV: Negative for chest pain, angina, palpitations, dyspnea on exertion, peripheral edema.  Respiratory: Negative for dyspnea at rest,  dyspnea on exertion, cough, sputum, wheezing.  GI: See history of present illness. GU:  Negative for dysuria, hematuria, urinary incontinence, urinary frequency, nocturnal urination.  Endo: Negative for unusual weight change.     Physical Exam   There were no vitals taken for this visit.   General: Well-nourished, well-developed in no acute distress.  Eyes: No icterus. Mouth: Oropharyngeal mucosa moist and pink   Lungs: Clear to auscultation bilaterally.  Heart: Regular rate and rhythm, no murmurs rubs or gallops.  Abdomen: Bowel sounds are normal, nontender, nondistended, no hepatosplenomegaly or masses,  no abdominal bruits or hernia , no rebound or guarding.  Rectal: not performed Extremities: No lower extremity edema. No clubbing or deformities. Neuro: Alert and oriented x 4   Skin: Warm and dry, no jaundice.   Psych: Alert and cooperative, normal mood and affect.  Labs   *** Imaging Studies   No results found.  Assessment/Plan:        PIERRETTE Sonny RAMAN. Ezzard, MHS, PA-C [facility]

## 2023-09-20 ENCOUNTER — Encounter: Payer: Self-pay | Admitting: Gastroenterology

## 2023-09-20 ENCOUNTER — Telehealth: Payer: Self-pay | Admitting: Gastroenterology

## 2023-09-20 NOTE — Telephone Encounter (Signed)
 Patient no showed appt. Please offer to reschedule her, nonurgent appointment.

## 2023-10-26 NOTE — Telephone Encounter (Signed)
 Several attempts to get patient rescheduled. No response. Letter mailed.
